# Patient Record
Sex: Female | Born: 1985 | Race: White | Hispanic: No | Marital: Single | Smoking: Never smoker
Health system: Southern US, Community
[De-identification: ages and names within clinical notes are randomized; demographics above are authoritative.]

## PROBLEM LIST (undated history)

## (undated) DIAGNOSIS — K219 Gastro-esophageal reflux disease without esophagitis: Secondary | ICD-10-CM

## (undated) HISTORY — DX: Gastro-esophageal reflux disease without esophagitis: K21.9

## (undated) HISTORY — PX: DILATION AND CURETTAGE OF UTERUS: SHX78

## (undated) HISTORY — PX: HERNIA REPAIR: SHX51

---

## 2014-07-01 DIAGNOSIS — L709 Acne, unspecified: Secondary | ICD-10-CM | POA: Insufficient documentation

## 2014-07-01 DIAGNOSIS — Z833 Family history of diabetes mellitus: Secondary | ICD-10-CM | POA: Insufficient documentation

## 2014-07-01 DIAGNOSIS — E042 Nontoxic multinodular goiter: Secondary | ICD-10-CM | POA: Insufficient documentation

## 2014-07-01 HISTORY — DX: Acne, unspecified: L70.9

## 2019-07-25 ENCOUNTER — Encounter: Payer: Self-pay | Admitting: Emergency Medicine

## 2019-07-25 ENCOUNTER — Ambulatory Visit
Admission: EM | Admit: 2019-07-25 | Discharge: 2019-07-25 | Disposition: A | Discharge status: Home / Self Care | Payer: Medicaid Other | Attending: Emergency Medicine | Admitting: Emergency Medicine

## 2019-07-25 ENCOUNTER — Other Ambulatory Visit: Payer: Self-pay

## 2019-07-25 DIAGNOSIS — N3001 Acute cystitis with hematuria: Secondary | ICD-10-CM

## 2019-07-25 DIAGNOSIS — B9689 Other specified bacterial agents as the cause of diseases classified elsewhere: Secondary | ICD-10-CM

## 2019-07-25 LAB — POCT URINALYSIS DIP (MANUAL ENTRY)
Bilirubin, UA: NEGATIVE
Glucose, UA: 100 mg/dL — AB
Ketones, POC UA: NEGATIVE mg/dL
Nitrite, UA: NEGATIVE
Protein Ur, POC: 30 mg/dL — AB
Spec Grav, UA: >=1.03 — AB (ref 1.010–1.025)
Urobilinogen, UA: 2 E.U./dL — AB
pH, UA: 7 (ref 5.0–8.0)

## 2019-07-25 LAB — POCT URINE PREGNANCY: Preg Test, Ur: NEGATIVE

## 2019-07-25 MED ORDER — CEPHALEXIN 500 MG PO CAPS: 500.0000 mg | ORAL_CAPSULE | Freq: Two times a day (BID) | ORAL | 0 refills | Status: AC

## 2019-07-25 NOTE — ED Provider Notes (Signed)
[name]    CSN: [id number] Arrival date & time: 07/25/19  1156      History   Chief Complaint Chief Complaint  Patient presents with  . Dysuria    HPI Penny Bartlett is a 34 y.o. female with history of UTI presenting for possible UTI.  Endorsing burning with urination since this morning.  Denying urinary urgency, frequency, vaginal discharge, pelvic or abdominal pain, back pain, fever.  Denies history of renal calculi.  LMP 2/26.  Has not taken anything for symptoms.    History reviewed. No pertinent past medical history.  There are no problems to display for this patient.   History reviewed. No pertinent surgical history.  OB History   No obstetric history on file.      Home Medications    Prior to Admission medications   Medication Sig Start Date End Date Taking? Authorizing Provider  cephALEXin (KEFLEX) 500 MG capsule Take 1 capsule (500 mg total) by mouth 2 (two) times daily for 5 days. 07/25/19 07/30/19  Hall-Potvin, [place], PA-C    Family History Family History  Family history unknown: Yes    Social History Social History   Tobacco Use  . Smoking status: Never Smoker  . Smokeless tobacco: Never Used  Substance Use Topics  . Alcohol use: Not Currently  . Drug use: Never     Allergies   Patient has no known allergies.   Review of Systems As per HPI   Physical Exam Triage Vital Signs ED Triage Vitals  Enc Vitals Group     BP      Pulse      Resp      Temp      Temp src      SpO2      Weight      Height      Head Circumference      Peak Flow      Pain Score      Pain Loc      Pain Edu?      Excl. in [place]?    No data found.  Updated Vital Signs BP 125/82 (BP Location: Left Arm)   Pulse 68   Temp (!) 97.5 F (36.4 C) (Oral)   Resp 16   LMP 07/05/2019   SpO2 99%   Visual Acuity Right Eye Distance:   Left Eye Distance:   Bilateral Distance:    Right Eye Near:   Left Eye Near:    Bilateral Near:      Physical Exam Constitutional:      General: She is not in acute distress. HENT:     Head: Normocephalic and atraumatic.  Eyes:     General: No scleral icterus.    Pupils: Pupils are equal, round, and reactive to light.  Cardiovascular:     Rate and Rhythm: Normal rate.  Pulmonary:     Effort: Pulmonary effort is normal.  Abdominal:     General: Bowel sounds are normal.     Palpations: Abdomen is soft.     Tenderness: There is no abdominal tenderness. There is no right CVA tenderness, left CVA tenderness or guarding.  Skin:    Coloration: Skin is not jaundiced or pale.  Neurological:     Mental Status: She is alert and oriented to person, place, and time.      UC Treatments / Results  Labs (all labs ordered are listed, but only abnormal results are displayed) Labs Reviewed  POCT URINALYSIS  DIP (MANUAL ENTRY) - Abnormal; Notable for the following components:      Result Value   Clarity, UA cloudy (*)    Glucose, UA =100 (*)    Spec Grav, UA >=1.030 (*)    Blood, UA trace-intact (*)    Protein Ur, POC =30 (*)    Urobilinogen, UA 2.0 (*)    Leukocytes, UA Small (1+) (*)    All other components within normal limits  URINE CULTURE  POCT URINE PREGNANCY    EKG   Radiology No results found.  Procedures Procedures (including critical care time)  Medications Ordered in UC Medications - No data to display  Initial Impression / Assessment and Plan / UC Course  I have reviewed the triage vital signs and the nursing notes.  Pertinent labs & imaging results that were available during my care of the patient were reviewed by me and considered in my medical decision making (see chart for details).     Patient afebrile, nontoxic in office today.  Urine dipstick significant for glucosuria, trace intact blood, urobilinogen, protein, small leukocytes-culture pending.  Will start Keflex today.  Return precautions discussed, patient verbalized understanding and is agreeable  to plan. Final Clinical Impressions(s) / UC Diagnoses   Final diagnoses:  Acute cystitis with hematuria     Discharge Instructions     Take antibiotic as prescribed with food. Follow-up with PCP for further evaluation if needed. Return for worsening symptoms, abdominal pain, back pain, fever.    ED Prescriptions    Medication Sig Dispense Auth. Provider   cephALEXin (KEFLEX) 500 MG capsule Take 1 capsule (500 mg total) by mouth 2 (two) times daily for 5 days. 10 capsule Hall-Potvin, [place], PA-C     PDMP not reviewed this encounter.   Hall-Potvin, [place], [place] 07/25/19 1301

## 2019-07-25 NOTE — ED Triage Notes (Signed)
Pt presents to [facility] for assessment of burning with urination starting this morning.  Denies any other symptoms, hx of UTIs.  LMP 2/26.

## 2019-07-25 NOTE — Discharge Instructions (Signed)
Take antibiotic as prescribed with food. Follow-up with PCP for further evaluation if needed. Return for worsening symptoms, abdominal pain, back pain, fever.

## 2019-07-27 LAB — URINE CULTURE: Culture: 70000 — AB

## 2019-11-15 ENCOUNTER — Encounter: Payer: Self-pay | Admitting: Emergency Medicine

## 2019-11-15 ENCOUNTER — Ambulatory Visit
Admission: EM | Admit: 2019-11-15 | Discharge: 2019-11-15 | Disposition: A | Discharge status: Home / Self Care | Payer: Medicaid Other

## 2019-11-15 ENCOUNTER — Other Ambulatory Visit: Payer: Self-pay

## 2019-11-15 DIAGNOSIS — R21 Rash and other nonspecific skin eruption: Secondary | ICD-10-CM

## 2019-11-15 MED ORDER — TRIAMCINOLONE ACETONIDE 0.1 % EX CREA: 1.0000 "application " | TOPICAL_CREAM | Freq: Two times a day (BID) | CUTANEOUS | 0 refills | Status: DC

## 2019-11-15 NOTE — ED Provider Notes (Signed)
[name]    CSN: [id number] Arrival date & time: 11/15/19  1805      History   Chief Complaint Chief Complaint  Patient presents with  . Rash    HPI Penny Bartlett is a 34 y.o. female.   34 year old female comes in for 2 day history of right upper arm rash. States felt the area and noticed the rash. Occasional itching, especially when outdoors. Denies spreading rash, spreading erythema, warmth, fever. No changes in hygiene products. But states possible exposures as around cats/dogs, and partner works outside with exposures to poison ivy.      History reviewed. No pertinent past medical history.  There are no problems to display for this patient.   History reviewed. No pertinent surgical history.  OB History   No obstetric history on file.      Home Medications    Prior to Admission medications   Medication Sig Start Date End Date Taking? Authorizing Provider  omeprazole (PRILOSEC) 20 MG capsule Take 20 mg by mouth daily.   Yes [provider]  triamcinolone cream (KENALOG) 0.1 % Apply 1 application topically 2 (two) times daily. 11/15/19   Belinda Fisher, PA-C    Family History Family History  Problem Relation Age of Onset  . Diabetes Mother   . Hypertension Mother     Social History Social History   Tobacco Use  . Smoking status: Never Smoker  . Smokeless tobacco: Never Used  Substance Use Topics  . Alcohol use: Not Currently  . Drug use: Never     Allergies   Patient has no known allergies.   Review of Systems Review of Systems  Reason unable to perform ROS: See HPI as above.     Physical Exam Triage Vital Signs ED Triage Vitals [11/15/19 1819]  Enc Vitals Group     BP 113/75     Pulse Rate 91     Resp 18     Temp 98.3 F (36.8 C)     Temp Source Oral     SpO2 98 %     Weight      Height      Head Circumference      Peak Flow      Pain Score 0     Pain Loc      Pain Edu?      Excl. in GC?    No data  found.  Updated Vital Signs BP 113/75 (BP Location: Left Arm)   Pulse 91   Temp 98.3 F (36.8 C) (Oral)   Resp 18   LMP 10/13/2019   SpO2 98%   Physical Exam Constitutional:      General: She is not in acute distress.    Appearance: Normal appearance. She is well-developed. She is not toxic-appearing or diaphoretic.  HENT:     Head: Normocephalic and atraumatic.  Eyes:     Conjunctiva/sclera: Conjunctivae normal.     Pupils: Pupils are equal, round, and reactive to light.  Pulmonary:     Effort: Pulmonary effort is normal. No respiratory distress.     Comments: Speaking in full sentences without difficulty Musculoskeletal:     Cervical back: Normal range of motion and neck supple.  Skin:    General: Skin is warm and dry.     Comments: Few maculopapular rash in linear pattern to medial elbow. No surrounding erythema, warmth. No tenderness  Neurological:     Mental Status: She is alert and  oriented to person, place, and time.      UC Treatments / Results  Labs (all labs ordered are listed, but only abnormal results are displayed) Labs Reviewed - No data to display  EKG   Radiology No results found.  Procedures Procedures (including critical care time)  Medications Ordered in UC Medications - No data to display  Initial Impression / Assessment and Plan / UC Course  I have reviewed the triage vital signs and the nursing notes.  Pertinent labs & imaging results that were available during my care of the patient were reviewed by me and considered in my medical decision making (see chart for details).    Likely contact dermatitis. Triamcinolone if needed for itching. Otherwise, continue to monitor for any exposures. Return precautions given.  Final Clinical Impressions(s) / UC Diagnoses   Final diagnoses:  Rash   ED Prescriptions    Medication Sig Dispense Auth. Provider   triamcinolone cream (KENALOG) 0.1 % Apply 1 application topically 2 (two) times daily.  30 g Belinda Fisher, PA-C     PDMP not reviewed this encounter.   Belinda Fisher, PA-C 11/15/19 1846

## 2019-11-15 NOTE — ED Triage Notes (Signed)
Pt presents to rash to right upper arm x 2 days.  States she is around cats, dogs, and her husband works outside.  Patient states randomly itchy.

## 2019-11-15 NOTE — Discharge Instructions (Signed)
Triamcinolone as directed. Avoid any new products. Monitor for spreading redness, increased warmth, pain, follow up for reevaluation.

## 2020-05-09 NOTE — L&D Delivery Note (Signed)
Delivery Note Penny Bartlett is a [id number] at [redacted]w[redacted]d who had a spontaneous delivery at 1958 a viable female ("Penny Bartlett") was delivered via ROA.  APGAR: 8, 9; weight 8lb6oz  .     Admitted for elective induction of labor. Induced with pitocin and AROM. Progressed normally. Received epidural for pain management. Pushed for 15 minutes. Baby was delivered without difficulty. Tight nuchal cord x 2 reduced after delivery.  Delayed cord clamping for 60 seconds. Delivery of placenta was spontaneous. Placenta was found to be intact, 3 -vessel cord was noted. The fundus was found to be firm. Perineum intact. Estimated blood loss 250cc. Instrument and gauze counts were correct at the end of the procedure.   Placenta status: to L&D for disposal  Anesthesia:  epidural Episiotomy:  none Lacerations:  none Suture Repair: none Est. Blood Loss (mL):   Mom to postpartum.  Baby to Couplet care / Skin to Skin.  [name] 02/18/2021, 8:20 PM

## 2020-05-11 ENCOUNTER — Ambulatory Visit
Admission: EM | Admit: 2020-05-11 | Discharge: 2020-05-11 | Disposition: A | Discharge status: Home / Self Care | Payer: Medicaid Other | Attending: Emergency Medicine | Admitting: Emergency Medicine

## 2020-05-11 DIAGNOSIS — B9789 Other viral agents as the cause of diseases classified elsewhere: Secondary | ICD-10-CM

## 2020-05-11 DIAGNOSIS — J028 Acute pharyngitis due to other specified organisms: Secondary | ICD-10-CM | POA: Diagnosis present

## 2020-05-11 DIAGNOSIS — J020 Streptococcal pharyngitis: Secondary | ICD-10-CM | POA: Diagnosis present

## 2020-05-11 DIAGNOSIS — Z20822 Contact with and (suspected) exposure to covid-19: Secondary | ICD-10-CM

## 2020-05-11 LAB — POCT RAPID STREP A (OFFICE): Rapid Strep A Screen: NEGATIVE

## 2020-05-11 MED ORDER — IBUPROFEN 800 MG PO TABS: 800.0000 mg | ORAL_TABLET | Freq: Three times a day (TID) | ORAL | 0 refills | Status: DC

## 2020-05-11 NOTE — ED Triage Notes (Signed)
Patient presents to Urgent Care with complaints of a sore throat x 2 days. She noted redness and white spots  yesterday. Treating symptoms with Dayquil.   Denies fever, n/v, or abdominal pain.

## 2020-05-11 NOTE — Discharge Instructions (Signed)
Sore Throat  Your rapid strep tested Negative today.  Covid test pending.  Sore throat likely viral and should resolve with time over the next 4 to 5 days.  Please continue Tylenol or Ibuprofen for fever and pain. May try salt water gargles, cepacol lozenges, throat spray, or OTC cold relief medicine for throat discomfort. If you also have congestion take a daily anti-histamine like Zyrtec, Claritin, and a oral decongestant to help with post nasal drip that may be irritating your throat.   Stay hydrated and drink plenty of fluids to keep your throat coated relieve irritation.

## 2020-05-11 NOTE — ED Provider Notes (Signed)
[name]    CSN: [id number] Arrival date & time: 05/11/20  0854      History   Chief Complaint Chief Complaint  Patient presents with  . Sore Throat    HPI Penny Bartlett is a 35 y.o. female presenting today for evaluation of sore throat.  Reports over the past 3 days has had sore throat.  Denies other associated URI symptoms of congestion or cough.  Denies fevers chills or body aches.  Denies nausea vomiting or diarrhea.  Noticed some redness in the white spot yesterday.  Using DayQuil.  Denies any close contacts with similar symptoms.  HPI  History reviewed. No pertinent past medical history.  There are no problems to display for this patient.   History reviewed. No pertinent surgical history.  OB History   No obstetric history on file.      Home Medications    Prior to Admission medications   Medication Sig Start Date End Date Taking? Authorizing Provider  omeprazole (PRILOSEC) 20 MG capsule Take 20 mg by mouth daily.    [provider]  triamcinolone cream (KENALOG) 0.1 % Apply 1 application topically 2 (two) times daily. 11/15/19   Belinda Fisher, PA-C    Family History Family History  Problem Relation Age of Onset  . Diabetes Mother   . Hypertension Mother     Social History Social History   Tobacco Use  . Smoking status: Never Smoker  . Smokeless tobacco: Never Used  Substance Use Topics  . Alcohol use: Not Currently  . Drug use: Never     Allergies   Patient has no known allergies.   Review of Systems Review of Systems  Constitutional: Negative for activity change, appetite change, chills, fatigue and fever.  HENT: Positive for congestion, rhinorrhea and sore throat. Negative for ear pain, sinus pressure and trouble swallowing.   Eyes: Negative for discharge and redness.  Respiratory: Positive for cough. Negative for chest tightness and shortness of breath.   Cardiovascular: Negative for chest pain.  Gastrointestinal:  Negative for abdominal pain, diarrhea, nausea and vomiting.  Musculoskeletal: Negative for myalgias.  Skin: Negative for rash.  Neurological: Negative for dizziness, light-headedness and headaches.     Physical Exam Triage Vital Signs ED Triage Vitals  Enc Vitals Group     BP      Pulse      Resp      Temp      Temp src      SpO2      Weight      Height      Head Circumference      Peak Flow      Pain Score      Pain Loc      Pain Edu?      Excl. in GC?    No data found.  Updated Vital Signs BP 122/80 (BP Location: Left Arm)   Pulse 74   Temp 98.1 F (36.7 C)   Resp 16   Wt 174 lb (78.9 kg)   LMP 04/24/2020   SpO2 98%   Visual Acuity Right Eye Distance:   Left Eye Distance:   Bilateral Distance:    Right Eye Near:   Left Eye Near:    Bilateral Near:     Physical Exam Vitals and nursing note reviewed.  Constitutional:      Appearance: She is well-developed and well-nourished.     Comments: No acute distress  HENT:  Head: Normocephalic and atraumatic.     Ears:     Comments: Bilateral ears without tenderness to palpation of external auricle, tragus and mastoid, EAC's without erythema or swelling, TM's with good bony landmarks and cone of light. Non erythematous.     Nose: Nose normal.     Mouth/Throat:     Comments: Oral mucosa pink and moist, no tonsillar enlargement or exudate, mild erythema noted just anterior to tonsils without swelling, posterior pharynx patent and nonerythematous, no uvula deviation or swelling. Normal phonation. Eyes:     Conjunctiva/sclera: Conjunctivae normal.  Cardiovascular:     Rate and Rhythm: Normal rate.  Pulmonary:     Effort: Pulmonary effort is normal. No respiratory distress.     Comments: Breathing comfortably at rest, CTABL, no wheezing, rales or other adventitious sounds auscultated Abdominal:     General: There is no distension.  Musculoskeletal:        General: Normal range of motion.     Cervical back:  Neck supple.  Skin:    General: Skin is warm and dry.  Neurological:     Mental Status: She is alert and oriented to person, place, and time.  Psychiatric:        Mood and Affect: Mood and affect normal.      UC Treatments / Results  Labs (all labs ordered are listed, but only abnormal results are displayed) Labs Reviewed  NOVEL CORONAVIRUS, NAA  CULTURE, GROUP A STREP [facility])  POCT RAPID STREP A (OFFICE)    EKG   Radiology No results found.  Procedures Procedures (including critical care time)  Medications Ordered in UC Medications - No data to display  Initial Impression / Assessment and Plan / UC Course  I have reviewed the triage vital signs and the nursing notes.  Pertinent labs & imaging results that were available during my care of the patient were reviewed by me and considered in my medical decision making (see chart for details).     Strep test negative, Covid test pending, recommending symptomatic and supportive care of sore throat with close monitoring, suspect viral etiology.  Discussed strict return precautions. Patient verbalized understanding and is agreeable with plan.  Final Clinical Impressions(s) / UC Diagnoses   Final diagnoses:  Encounter for screening laboratory testing for COVID-19 virus  Streptococcal sore throat  Sore throat (viral)     Discharge Instructions     Sore Throat  Your rapid strep tested Negative today.  Covid test pending.  Sore throat likely viral and should resolve with time over the next 4 to 5 days.  Please continue Tylenol or Ibuprofen for fever and pain. May try salt water gargles, cepacol lozenges, throat spray, or OTC cold relief medicine for throat discomfort. If you also have congestion take a daily anti-histamine like Zyrtec, Claritin, and a oral decongestant to help with post nasal drip that may be irritating your throat.   Stay hydrated and drink plenty of fluids to keep your throat coated relieve irritation.      ED Prescriptions    None     PDMP not reviewed this encounter.   Lew Dawes, [place] 05/11/20 [phone]

## 2020-05-13 ENCOUNTER — Encounter: Payer: Self-pay | Admitting: Emergency Medicine

## 2020-05-13 ENCOUNTER — Ambulatory Visit
Admission: EM | Admit: 2020-05-13 | Discharge: 2020-05-13 | Disposition: A | Discharge status: Home / Self Care | Payer: Medicaid Other | Attending: Internal Medicine | Admitting: Internal Medicine

## 2020-05-13 DIAGNOSIS — Z20822 Contact with and (suspected) exposure to covid-19: Secondary | ICD-10-CM | POA: Diagnosis not present

## 2020-05-13 LAB — NOVEL CORONAVIRUS, NAA

## 2020-05-13 NOTE — ED Triage Notes (Signed)
Pt here for covid recollection

## 2020-05-14 LAB — CULTURE, GROUP A STREP (THRC)

## 2020-05-15 LAB — SARS-COV-2, NAA 2 DAY TAT

## 2020-05-15 LAB — NOVEL CORONAVIRUS, NAA: SARS-CoV-2, NAA: NOT DETECTED

## 2020-06-21 ENCOUNTER — Ambulatory Visit
Admission: EM | Admit: 2020-06-21 | Discharge: 2020-06-21 | Disposition: A | Discharge status: Home / Self Care | Payer: Medicaid Other | Attending: Internal Medicine | Admitting: Internal Medicine

## 2020-06-21 ENCOUNTER — Other Ambulatory Visit: Payer: Self-pay

## 2020-06-21 DIAGNOSIS — Z3201 Encounter for pregnancy test, result positive: Secondary | ICD-10-CM

## 2020-06-21 LAB — POCT URINE PREGNANCY: Preg Test, Ur: POSITIVE — AB

## 2020-06-21 NOTE — ED Triage Notes (Signed)
Patient believes she may be pregnant and is here for a test. PT does not want the child with her to know. PT is aox4 and ambulatory.

## 2020-06-21 NOTE — ED Provider Notes (Signed)
Penny Bartlett    CSN: [id number] Arrival date & time: 06/21/20  1445      History   Chief Complaint Chief Complaint  Patient presents with  . Possible Pregnancy    Wants test    HPI Penny Bartlett is a 35 y.o. female.   Here today requesting pregnancy confirmation. States her period is to start tomorrow and she has felt bloated so she took several home pregnancy tests that were all positive. Not on contraception at this time. Denies vaginal bleeding, cramping, abdominal pain, N/V/D. Does not currently have an OBGYN in the area, moved here from another county.      History reviewed. No pertinent past medical history.  There are no problems to display for this patient.   History reviewed. No pertinent surgical history.  OB History    Gravida  1   Para      Term      Preterm      AB      Living        SAB      IAB      Ectopic      Multiple      Live Births               Home Medications    Prior to Admission medications   Medication Sig Start Date End Date Taking? Authorizing Provider  ibuprofen (ADVIL) 800 MG tablet Take 1 tablet (800 mg total) by mouth 3 (three) times daily. 05/11/20  Yes Wieters, Hallie C, PA-C  omeprazole (PRILOSEC) 20 MG capsule Take 20 mg by mouth daily.    [provider]  triamcinolone cream (KENALOG) 0.1 % Apply 1 application topically 2 (two) times daily. 11/15/19   Belinda Fisher, PA-C    Family History Family History  Problem Relation Age of Onset  . Diabetes Mother   . Hypertension Mother     Social History Social History   Tobacco Use  . Smoking status: Never Smoker  . Smokeless tobacco: Never Used  Vaping Use  . Vaping Use: Never used  Substance Use Topics  . Alcohol use: Not Currently  . Drug use: Never     Allergies   Patient has no known allergies.   Review of Systems Review of Systems PER HPI   Physical Exam Triage Vital Signs ED Triage Vitals  Enc Vitals Group     BP       Pulse      Resp      Temp      Temp src      SpO2      Weight      Height      Head Circumference      Peak Flow      Pain Score      Pain Loc      Pain Edu?      Excl. in GC?    No data found.  Updated Vital Signs BP 114/75 (BP Location: Left Arm)   Pulse (!) 109   Temp 98.2 F (36.8 C) (Oral)   Resp 18   LMP 04/24/2020   SpO2 98%   Visual Acuity Right Eye Distance:   Left Eye Distance:   Bilateral Distance:    Right Eye Near:   Left Eye Near:    Bilateral Near:     Physical Exam Vitals and nursing note reviewed.  Constitutional:      Appearance: Normal appearance. She is  not ill-appearing.  HENT:     Head: Atraumatic.  Eyes:     Extraocular Movements: Extraocular movements intact.     Conjunctiva/sclera: Conjunctivae normal.  Cardiovascular:     Rate and Rhythm: Normal rate and regular rhythm.     Heart sounds: Normal heart sounds.  Pulmonary:     Effort: Pulmonary effort is normal.     Breath sounds: Normal breath sounds.  Abdominal:     General: Bowel sounds are normal. There is no distension.     Palpations: Abdomen is soft.     Tenderness: There is no abdominal tenderness. There is no right CVA tenderness, left CVA tenderness or guarding.  Musculoskeletal:        General: Normal range of motion.     Cervical back: Normal range of motion and neck supple.  Skin:    General: Skin is warm and dry.  Neurological:     Mental Status: She is alert and oriented to person, place, and time.  Psychiatric:        Mood and Affect: Mood normal.        Thought Content: Thought content normal.        Judgment: Judgment normal.      UC Treatments / Results  Labs (all labs ordered are listed, but only abnormal results are displayed) Labs Reviewed  POCT URINE PREGNANCY - Abnormal; Notable for the following components:      Result Value   Preg Test, Ur Positive (*)    All other components within normal limits    EKG   Radiology No results  found.  Procedures Procedures (including critical Bartlett time)  Medications Ordered in UC Medications - No data to display  Initial Impression / Assessment and Plan / UC Course  I have reviewed the triage vital signs and the nursing notes.  Pertinent labs & imaging results that were available during my Bartlett of the patient were reviewed by me and considered in my medical decision making (see chart for details).     Urine preg positive, discussed prenatal vitamins and lifestyle, resources given for local OBGYN clinics. Patient to schedule initial OB visit.   Final Clinical Impressions(s) / UC Diagnoses   Final diagnoses:  Positive pregnancy test   Discharge Instructions   None    ED Prescriptions    None     PDMP not reviewed this encounter.   Particia Bartlett, [place] 06/21/20 1530

## 2020-07-31 LAB — OB RESULTS CONSOLE RUBELLA ANTIBODY, IGM: Rubella: IMMUNE

## 2020-07-31 LAB — OB RESULTS CONSOLE GC/CHLAMYDIA
Chlamydia: NEGATIVE
Gonorrhea: NEGATIVE

## 2020-07-31 LAB — OB RESULTS CONSOLE ABO/RH: RH Type: POSITIVE

## 2020-07-31 LAB — OB RESULTS CONSOLE HEPATITIS B SURFACE ANTIGEN: Hepatitis B Surface Ag: NEGATIVE

## 2020-07-31 LAB — OB RESULTS CONSOLE ANTIBODY SCREEN: Antibody Screen: NEGATIVE

## 2020-07-31 LAB — OB RESULTS CONSOLE HIV ANTIBODY (ROUTINE TESTING): HIV: NONREACTIVE

## 2020-07-31 LAB — OB RESULTS CONSOLE RPR: RPR: NONREACTIVE

## 2020-08-27 ENCOUNTER — Other Ambulatory Visit: Payer: Self-pay

## 2020-08-27 ENCOUNTER — Other Ambulatory Visit: Payer: Self-pay | Admitting: Obstetrics and Gynecology

## 2020-08-27 DIAGNOSIS — O09522 Supervision of elderly multigravida, second trimester: Secondary | ICD-10-CM

## 2020-09-28 ENCOUNTER — Encounter: Payer: Self-pay | Admitting: *Deleted

## 2020-10-01 ENCOUNTER — Ambulatory Visit: Payer: Medicaid Other | Attending: Obstetrics and Gynecology

## 2020-10-01 ENCOUNTER — Other Ambulatory Visit: Payer: Self-pay | Admitting: Obstetrics

## 2020-10-01 ENCOUNTER — Other Ambulatory Visit: Payer: Self-pay

## 2020-10-01 ENCOUNTER — Ambulatory Visit: Payer: Medicaid Other | Admitting: *Deleted

## 2020-10-01 ENCOUNTER — Encounter: Payer: Self-pay | Admitting: *Deleted

## 2020-10-01 VITALS — BP 116/58 | HR 79 | Ht 66.0 in

## 2020-10-01 DIAGNOSIS — O09522 Supervision of elderly multigravida, second trimester: Secondary | ICD-10-CM

## 2020-10-01 DIAGNOSIS — O359XX0 Maternal care for (suspected) fetal abnormality and damage, unspecified, not applicable or unspecified: Secondary | ICD-10-CM

## 2020-10-28 ENCOUNTER — Ambulatory Visit: Payer: Medicaid Other | Admitting: *Deleted

## 2020-10-28 ENCOUNTER — Other Ambulatory Visit: Payer: Self-pay

## 2020-10-28 ENCOUNTER — Ambulatory Visit: Payer: Medicaid Other | Attending: Obstetrics

## 2020-10-28 VITALS — BP 119/62 | HR 77

## 2020-10-28 DIAGNOSIS — O09522 Supervision of elderly multigravida, second trimester: Secondary | ICD-10-CM

## 2020-10-28 DIAGNOSIS — O359XX0 Maternal care for (suspected) fetal abnormality and damage, unspecified, not applicable or unspecified: Secondary | ICD-10-CM | POA: Insufficient documentation

## 2020-10-28 DIAGNOSIS — Z3A22 22 weeks gestation of pregnancy: Secondary | ICD-10-CM | POA: Diagnosis not present

## 2020-10-28 DIAGNOSIS — Z362 Encounter for other antenatal screening follow-up: Secondary | ICD-10-CM

## 2021-01-28 LAB — OB RESULTS CONSOLE GBS: GBS: POSITIVE

## 2021-02-11 ENCOUNTER — Encounter (HOSPITAL_COMMUNITY): Payer: Self-pay | Admitting: *Deleted

## 2021-02-11 ENCOUNTER — Telehealth (HOSPITAL_COMMUNITY): Payer: Self-pay | Admitting: *Deleted

## 2021-02-11 ENCOUNTER — Encounter (HOSPITAL_COMMUNITY): Payer: Self-pay

## 2021-02-11 NOTE — Telephone Encounter (Signed)
Preadmission screen  

## 2021-02-16 ENCOUNTER — Other Ambulatory Visit: Payer: Self-pay | Admitting: Obstetrics and Gynecology

## 2021-02-16 LAB — SARS CORONAVIRUS 2 (TAT 6-24 HRS): SARS Coronavirus 2: NEGATIVE

## 2021-02-18 ENCOUNTER — Inpatient Hospital Stay (HOSPITAL_COMMUNITY)
Admission: AD | Admit: 2021-02-18 | Discharge: 2021-02-20 | DRG: 806 | Disposition: A | Discharge status: Home / Self Care | Payer: Medicaid Other | Attending: Obstetrics and Gynecology | Admitting: Obstetrics and Gynecology

## 2021-02-18 ENCOUNTER — Other Ambulatory Visit: Payer: Self-pay

## 2021-02-18 ENCOUNTER — Inpatient Hospital Stay (HOSPITAL_COMMUNITY): Payer: Medicaid Other

## 2021-02-18 ENCOUNTER — Inpatient Hospital Stay (HOSPITAL_COMMUNITY): Payer: Medicaid Other | Admitting: Anesthesiology

## 2021-02-18 ENCOUNTER — Encounter (HOSPITAL_COMMUNITY): Payer: Self-pay | Admitting: Obstetrics and Gynecology

## 2021-02-18 DIAGNOSIS — Z3A39 39 weeks gestation of pregnancy: Secondary | ICD-10-CM | POA: Diagnosis not present

## 2021-02-18 DIAGNOSIS — O9081 Anemia of the puerperium: Secondary | ICD-10-CM | POA: Diagnosis not present

## 2021-02-18 DIAGNOSIS — D62 Acute posthemorrhagic anemia: Secondary | ICD-10-CM | POA: Diagnosis not present

## 2021-02-18 DIAGNOSIS — O99824 Streptococcus B carrier state complicating childbirth: Secondary | ICD-10-CM | POA: Diagnosis present

## 2021-02-18 DIAGNOSIS — O09529 Supervision of elderly multigravida, unspecified trimester: Secondary | ICD-10-CM

## 2021-02-18 DIAGNOSIS — O26893 Other specified pregnancy related conditions, third trimester: Secondary | ICD-10-CM | POA: Diagnosis present

## 2021-02-18 LAB — CBC
HCT: 31.6 % — ABNORMAL LOW (ref 36.0–46.0)
Hemoglobin: 10.9 g/dL — ABNORMAL LOW (ref 12.0–15.0)
MCH: 32.7 pg (ref 26.0–34.0)
MCHC: 34.5 g/dL (ref 30.0–36.0)
MCV: 94.9 fL (ref 80.0–100.0)
Platelets: 195 10*3/uL (ref 150–400)
RBC: 3.33 MIL/uL — ABNORMAL LOW (ref 3.87–5.11)
RDW: 13.5 % (ref 11.5–15.5)
WBC: 10.2 10*3/uL (ref 4.0–10.5)
nRBC: 0 % (ref 0.0–0.2)

## 2021-02-18 LAB — TYPE AND SCREEN
ABO/RH(D): A POS
Antibody Screen: NEGATIVE

## 2021-02-18 LAB — RPR: RPR Ser Ql: NONREACTIVE

## 2021-02-18 MED ORDER — DIBUCAINE (PERIANAL) 1 % EX OINT: 1.0000 "application " | TOPICAL_OINTMENT | CUTANEOUS | Status: DC | PRN

## 2021-02-18 MED ORDER — ONDANSETRON HCL 4 MG/2ML IJ SOLN: 4.0000 mg | Freq: Four times a day (QID) | INTRAMUSCULAR | Status: DC | PRN

## 2021-02-18 MED ORDER — FENTANYL-BUPIVACAINE-NACL 0.5-0.125-0.9 MG/250ML-% EP SOLN
12.0000 mL/h | EPIDURAL | Status: DC | PRN
  Administered 2021-02-18: 12 mL/h via EPIDURAL
  Filled 2021-02-18: qty 250

## 2021-02-18 MED ORDER — ONDANSETRON HCL 4 MG PO TABS: 4.0000 mg | ORAL_TABLET | ORAL | Status: DC | PRN

## 2021-02-18 MED ORDER — LIDOCAINE HCL (PF) 1 % IJ SOLN: 30.0000 mL | INTRAMUSCULAR | Status: DC | PRN

## 2021-02-18 MED ORDER — DIPHENHYDRAMINE HCL 25 MG PO CAPS: 25.0000 mg | ORAL_CAPSULE | Freq: Four times a day (QID) | ORAL | Status: DC | PRN

## 2021-02-18 MED ORDER — ONDANSETRON HCL 4 MG/2ML IJ SOLN: 4.0000 mg | INTRAMUSCULAR | Status: DC | PRN

## 2021-02-18 MED ORDER — TERBUTALINE SULFATE 1 MG/ML IJ SOLN: 0.2500 mg | Freq: Once | INTRAMUSCULAR | Status: DC | PRN

## 2021-02-18 MED ORDER — LIDOCAINE HCL (PF) 1 % IJ SOLN
INTRAMUSCULAR | Status: DC | PRN
  Administered 2021-02-18: 10 mL via EPIDURAL

## 2021-02-18 MED ORDER — TETANUS-DIPHTH-ACELL PERTUSSIS 5-2.5-18.5 LF-MCG/0.5 IM SUSY: 0.5000 mL | PREFILLED_SYRINGE | Freq: Once | INTRAMUSCULAR | Status: DC

## 2021-02-18 MED ORDER — IBUPROFEN 600 MG PO TABS
600.0000 mg | ORAL_TABLET | Freq: Four times a day (QID) | ORAL | Status: DC
  Administered 2021-02-19 – 2021-02-20 (×6): 600 mg via ORAL
  Filled 2021-02-18 (×6): qty 1

## 2021-02-18 MED ORDER — FENTANYL CITRATE (PF) 100 MCG/2ML IJ SOLN: 50.0000 ug | INTRAMUSCULAR | Status: DC | PRN

## 2021-02-18 MED ORDER — OXYTOCIN BOLUS FROM INFUSION
333.0000 mL | Freq: Once | INTRAVENOUS | Status: AC
  Administered 2021-02-18: 333 mL via INTRAVENOUS

## 2021-02-18 MED ORDER — LACTATED RINGERS IV SOLN: 500.0000 mL | INTRAVENOUS | Status: DC | PRN

## 2021-02-18 MED ORDER — DOCUSATE SODIUM 100 MG PO CAPS
100.0000 mg | ORAL_CAPSULE | Freq: Two times a day (BID) | ORAL | Status: DC
  Administered 2021-02-19 – 2021-02-20 (×3): 100 mg via ORAL
  Filled 2021-02-18 (×3): qty 1

## 2021-02-18 MED ORDER — SOD CITRATE-CITRIC ACID 500-334 MG/5ML PO SOLN
30.0000 mL | ORAL | Status: DC | PRN
  Administered 2021-02-18: 30 mL via ORAL
  Filled 2021-02-18: qty 30

## 2021-02-18 MED ORDER — OXYCODONE HCL 5 MG PO TABS: 10.0000 mg | ORAL_TABLET | ORAL | Status: DC | PRN

## 2021-02-18 MED ORDER — PENICILLIN G POT IN DEXTROSE 60000 UNIT/ML IV SOLN
3.0000 10*6.[IU] | INTRAVENOUS | Status: DC
Start: 2021-02-18 — End: 2021-02-18
  Administered 2021-02-18 (×2): 3 10*6.[IU] via INTRAVENOUS
  Filled 2021-02-18 (×2): qty 50

## 2021-02-18 MED ORDER — COCONUT OIL OIL: 1.0000 "application " | TOPICAL_OIL | Status: DC | PRN

## 2021-02-18 MED ORDER — FLEET ENEMA 7-19 GM/118ML RE ENEM: 1.0000 | ENEMA | Freq: Every day | RECTAL | Status: DC | PRN

## 2021-02-18 MED ORDER — OXYTOCIN-SODIUM CHLORIDE 30-0.9 UT/500ML-% IV SOLN
1.0000 m[IU]/min | INTRAVENOUS | Status: DC
  Administered 2021-02-18: 2 m[IU]/min via INTRAVENOUS

## 2021-02-18 MED ORDER — SIMETHICONE 80 MG PO CHEW: 80.0000 mg | CHEWABLE_TABLET | ORAL | Status: DC | PRN

## 2021-02-18 MED ORDER — OXYTOCIN-SODIUM CHLORIDE 30-0.9 UT/500ML-% IV SOLN
2.5000 [IU]/h | INTRAVENOUS | Status: DC
  Administered 2021-02-18: 2.5 [IU]/h via INTRAVENOUS
  Filled 2021-02-18: qty 500

## 2021-02-18 MED ORDER — PRENATAL MULTIVITAMIN CH
1.0000 | ORAL_TABLET | Freq: Every day | ORAL | Status: DC
  Administered 2021-02-19 – 2021-02-20 (×2): 1 via ORAL
  Filled 2021-02-18 (×2): qty 1

## 2021-02-18 MED ORDER — OXYCODONE-ACETAMINOPHEN 5-325 MG PO TABS: 1.0000 | ORAL_TABLET | ORAL | Status: DC | PRN

## 2021-02-18 MED ORDER — EPHEDRINE 5 MG/ML INJ: 10.0000 mg | INTRAVENOUS | Status: DC | PRN

## 2021-02-18 MED ORDER — BISACODYL 10 MG RE SUPP: 10.0000 mg | Freq: Every day | RECTAL | Status: DC | PRN

## 2021-02-18 MED ORDER — PHENYLEPHRINE 40 MCG/ML (10ML) SYRINGE FOR IV PUSH (FOR BLOOD PRESSURE SUPPORT): 80.0000 ug | PREFILLED_SYRINGE | INTRAVENOUS | Status: DC | PRN

## 2021-02-18 MED ORDER — ACETAMINOPHEN 325 MG PO TABS: 650.0000 mg | ORAL_TABLET | ORAL | Status: DC | PRN

## 2021-02-18 MED ORDER — OXYCODONE HCL 5 MG PO TABS
5.0000 mg | ORAL_TABLET | ORAL | Status: DC | PRN
Start: 2021-02-18 — End: 2021-02-20

## 2021-02-18 MED ORDER — WITCH HAZEL-GLYCERIN EX PADS: 1.0000 "application " | MEDICATED_PAD | CUTANEOUS | Status: DC | PRN

## 2021-02-18 MED ORDER — BENZOCAINE-MENTHOL 20-0.5 % EX AERO: 1.0000 "application " | INHALATION_SPRAY | CUTANEOUS | Status: DC | PRN

## 2021-02-18 MED ORDER — MISOPROSTOL 25 MCG QUARTER TABLET: 25.0000 ug | ORAL_TABLET | ORAL | Status: DC | PRN

## 2021-02-18 MED ORDER — SODIUM CHLORIDE 0.9 % IV SOLN
5.0000 10*6.[IU] | Freq: Once | INTRAVENOUS | Status: AC
  Administered 2021-02-18: 5 10*6.[IU] via INTRAVENOUS
  Filled 2021-02-18: qty 5

## 2021-02-18 MED ORDER — LACTATED RINGERS IV SOLN
500.0000 mL | Freq: Once | INTRAVENOUS | Status: AC
  Administered 2021-02-18: 500 mL via INTRAVENOUS

## 2021-02-18 MED ORDER — LACTATED RINGERS IV SOLN
INTRAVENOUS | Status: DC
  Administered 2021-02-18: 950 mL via INTRAVENOUS
  Administered 2021-02-18: 1000 mL via INTRAVENOUS

## 2021-02-18 MED ORDER — DIPHENHYDRAMINE HCL 50 MG/ML IJ SOLN
12.5000 mg | INTRAMUSCULAR | Status: DC | PRN
Start: 2021-02-18 — End: 2021-02-18

## 2021-02-18 MED ORDER — OXYCODONE-ACETAMINOPHEN 5-325 MG PO TABS: 2.0000 | ORAL_TABLET | ORAL | Status: DC | PRN

## 2021-02-18 NOTE — Anesthesia Procedure Notes (Signed)
Epidural Patient location during procedure: OB Start time: 02/18/2021 12:03 PM End time: 02/18/2021 12:15 PM  Staffing Anesthesiologist: Lucretia Kern, MD Performed: anesthesiologist   Preanesthetic Checklist Completed: patient identified, IV checked, risks and benefits discussed, monitors and equipment checked, pre-op evaluation and timeout performed  Epidural Patient position: sitting Prep: DuraPrep Patient monitoring: heart rate, continuous pulse ox and blood pressure Approach: midline Location: L3-L4 Injection technique: LOR air  Needle:  Needle type: Tuohy  Needle gauge: 17 G Needle length: 9 cm Needle insertion depth: 7 cm Catheter type: closed end flexible Catheter size: 19 Gauge Catheter at skin depth: 12 cm Test dose: negative  Assessment Events: blood not aspirated, injection not painful, no injection resistance, no paresthesia and negative IV test  Additional Notes Reason for block:procedure for pain

## 2021-02-18 NOTE — Lactation Note (Signed)
This note was copied from a baby's chart. Lactation Consultation Note  Patient Name: Penny Bartlett Today's Date: 02/18/2021 Reason for consult: L&D Initial assessment;1st time breastfeeding;Term Age:35 hours  L&D consult with <60 minutes old infant and P5 mother. Congratulated family on newborn.  Offered assistance with latch, laid back position. Infant latched after a couple of attempts. Observed suckling and a few swallows.   Discussed STS as ideal transition for infants after birth helping with temperature, blood sugar and comfort. Talked about primal reflexes such as rooting, hands to mouth, searching for the breast among others. Explained LC services availability during postpartum stay. Thanked family for their time.      Maternal Data Has patient been taught Hand Expression?: Yes Does the patient have breastfeeding experience prior to this delivery?: Yes How long did the patient breastfeed?: a couple of days x4  Feeding Mother's Current Feeding Choice: Breast Milk  LATCH Score Latch: Grasps breast easily, tongue down, lips flanged, rhythmical sucking.  Audible Swallowing: A few with stimulation  Type of Nipple: Everted at rest and after stimulation  Comfort (Breast/Nipple): Soft / non-tender  Hold (Positioning): Assistance needed to correctly position infant at breast and maintain latch.  LATCH Score: 8  Interventions Interventions: Breast feeding basics reviewed;Assisted with latch;Skin to skin;Education  Discharge Pump: Personal WIC Program: Yes  Consult Status Consult Status: Follow-up from L&D    Penny Bartlett A Higuera Ancidey 02/18/2021, 8:45 PM

## 2021-02-18 NOTE — Plan of Care (Signed)
  Problem: Education: Goal: Knowledge of Childbirth will improve Outcome: Adequate for Discharge Goal: Ability to make informed decisions regarding treatment and plan of care will improve Outcome: Adequate for Discharge Goal: Ability to state and carry out methods to decrease the pain will improve Outcome: Adequate for Discharge Goal: Individualized Educational Video(s) Outcome: Not Applicable   Problem: Coping: Goal: Ability to verbalize concerns and feelings about labor and delivery will improve Outcome: Completed/Met   Problem: Life Cycle: Goal: Ability to make normal progression through stages of labor will improve Outcome: Completed/Met Goal: Ability to effectively push during vaginal delivery will improve Outcome: Completed/Met   Problem: Role Relationship: Goal: Will demonstrate positive interactions with the child Outcome: Completed/Met   Problem: Safety: Goal: Risk of complications during labor and delivery will decrease Outcome: Adequate for Discharge   Problem: Pain Management: Goal: Relief or control of pain from uterine contractions will improve Outcome: Adequate for Discharge

## 2021-02-18 NOTE — Progress Notes (Signed)
OB Progress Note  S: comfortable with epidural   O: Today's Vitals   02/18/21 1532 02/18/21 1601 02/18/21 1632 02/18/21 1645  BP: 110/68 118/73 109/60   Pulse: 79 84 81   Resp:      Temp:      TempSrc:      SpO2:      Weight:      Height:      PainSc:    0-No pain   Body mass index is 34.9 kg/m.  SVE 6.5/70/-1, AROM copious clear fluid   FHR: 120bpm, moderate variability, + accels, no decels Toco: ctx q 2-3 mins    A/P: 35Y [id number] @ [redacted]w[redacted]d, elective IOL Fetal wellbeing: cat I tracing IOL: continue pitocin, s/p AROM, anticipate SVD GBS positive: penicillin Pain control: epidural   M. Timothy Lasso, MD

## 2021-02-18 NOTE — H&P (Signed)
Penny Bartlett is a 35 y.o. female [id number] [redacted]w[redacted]d presenting for elective induction of labor. She reports no LOF, VB, Contractions. Normal FM.   Pregnancy c/b: Advanced maternal age: 78 History of fetal macrosomia: largest baby 9lb15oz, current pregnancy growth scan at 36weeks 6lb4oz (59%)  OB History     Gravida  6   Para  4   Term  4   Preterm      AB  1   Living  4      SAB  1   IAB      Ectopic      Multiple      Live Births             Past Medical History:  Diagnosis Date   GERD (gastroesophageal reflux disease)    Past Surgical History:  Procedure Laterality Date   DILATION AND CURETTAGE OF UTERUS     HERNIA REPAIR     Umbilical   Family History: family history includes Diabetes in her mother; Hypertension in her mother. Social History:  reports that she has never smoked. She has never used smokeless tobacco. She reports that she does not currently use alcohol. She reports that she does not use drugs.     Maternal Diabetes: No Genetic Screening: Normal Maternal Ultrasounds/Referrals: Normal, Growth scan at 36 weeks: EFW 59.9% (6lb4oz), BPP 8/8, vtx. Fetal Ultrasounds or other Referrals:  None Maternal Substance Abuse:  No Significant Maternal Medications:  None Significant Maternal Lab Results:  Group B Strep positive Other Comments:  None  Review of Systems Per HPI Exam Physical Exam  Dilation: 4 Effacement (%): 70 Station: -3 Exam by:: Londell Moh, RN Blood pressure 124/78, pulse 84, temperature 98.2 F (36.8 C), temperature source Oral, resp. rate 16, height 5' 6.5" (1.689 m), weight 99.6 kg, last menstrual period 05/21/2020. Gen: NAD, resting comfortably CVS: normal pulses Lungs: nonlabored respirations Abd: Gravid abdomen, Leop[place] 7.5Lbs Ext no calf edema or tenderness  Fetal testing: 120bpm, moderate variability, + accels, no decels Toco: ctx q 2-3 mins  Prenatal labs: ABO, Rh:  --/--/A POS (10/13 3818) Antibody: NEG  (10/13 4037) Rubella: Immune (03/25 0000) RPR: Nonreactive (03/25 0000)  HBsAg: Negative (03/25 0000)  HIV: Non-reactive (03/25 0000)  GBS: Positive/-- (09/22 0000)   Assessment/Plan: 54H [id number] @ [redacted]w[redacted]d, elective IOL Fetal wellbeing: cat I tracing IOL: continue pitocin, plan for AROM after 4 hours of abx and when fetal station lower GBS positive: penicillin Pain control: planning epidural prior to AROM  [name] 02/18/2021, 11:19 AM

## 2021-02-18 NOTE — Anesthesia Preprocedure Evaluation (Signed)
Anesthesia Evaluation  Patient identified by MRN, date of birth, ID band Patient awake    Reviewed: Allergy & Precautions, H&P , NPO status , Patient's Chart, lab work & pertinent test results  History of Anesthesia Complications Negative for: history of anesthetic complications  Airway Mallampati: II  TM Distance: >3 FB     Dental   Pulmonary neg pulmonary ROS,    Pulmonary exam normal        Cardiovascular negative cardio ROS   Rhythm:regular Rate:Normal     Neuro/Psych negative neurological ROS  negative psych ROS   GI/Hepatic Neg liver ROS, GERD  ,  Endo/Other  negative endocrine ROS  Renal/GU negative Renal ROS  negative genitourinary   Musculoskeletal negative musculoskeletal ROS (+)   Abdominal   Peds  Hematology negative hematology ROS (+)   Anesthesia Other Findings   Reproductive/Obstetrics (+) Pregnancy                             Anesthesia Physical Anesthesia Plan  ASA: 2  Anesthesia Plan: Epidural   Post-op Pain Management:    Induction:   PONV Risk Score and Plan:   Airway Management Planned:   Additional Equipment:   Intra-op Plan:   Post-operative Plan:   Informed Consent: I have reviewed the patients History and Physical, chart, labs and discussed the procedure including the risks, benefits and alternatives for the proposed anesthesia with the patient or authorized representative who has indicated his/her understanding and acceptance.       Plan Discussed with:   Anesthesia Plan Comments:         Anesthesia Quick Evaluation

## 2021-02-18 NOTE — Plan of Care (Signed)
  Problem: Education: Goal: Ability to make informed decisions regarding treatment and plan of care will improve Outcome: Progressing Goal: Ability to state and carry out methods to decrease the pain will improve Outcome: Progressing   Problem: Coping: Goal: Ability to verbalize concerns and feelings about labor and delivery will improve Outcome: Progressing

## 2021-02-18 NOTE — Progress Notes (Signed)
OB Progress Note  S: Comfortable with epidural   O: Today's Vitals   02/18/21 1336 02/18/21 1346 02/18/21 1402 02/18/21 1432  BP:   128/70 119/79  Pulse:   84 87  Resp:      Temp:      TempSrc:      SpO2: 97% 98%    Weight:      Height:      PainSc:       Body mass index is 34.9 kg/m.  SVE 5.5/70/-3, ballotable  FHR: 115bpm, mod variability, + accels, no decels Toco: ctx q 2-5 min   A/P: 35Y [id number] @ [redacted]w[redacted]d, elective IOL Fetal wellbeing: cat I tracing IOL: continue pitocin, fetal head very high and ballotable, will hold off on AROM until better engaged GBS positive: penicillin Pain control: epidural  M. Timothy Lasso, MD

## 2021-02-19 LAB — CBC
HCT: 26.2 % — ABNORMAL LOW (ref 36.0–46.0)
Hemoglobin: 8.9 g/dL — ABNORMAL LOW (ref 12.0–15.0)
MCH: 32.5 pg (ref 26.0–34.0)
MCHC: 34 g/dL (ref 30.0–36.0)
MCV: 95.6 fL (ref 80.0–100.0)
Platelets: 174 10*3/uL (ref 150–400)
RBC: 2.74 MIL/uL — ABNORMAL LOW (ref 3.87–5.11)
RDW: 13.7 % (ref 11.5–15.5)
WBC: 10.5 10*3/uL (ref 4.0–10.5)
nRBC: 0 % (ref 0.0–0.2)

## 2021-02-19 MED ORDER — FERROUS SULFATE 325 (65 FE) MG PO TABS
325.0000 mg | ORAL_TABLET | Freq: Every day | ORAL | Status: DC
  Administered 2021-02-19 – 2021-02-20 (×2): 325 mg via ORAL
  Filled 2021-02-19 (×2): qty 1

## 2021-02-19 NOTE — Lactation Note (Signed)
This note was copied from a baby's chart. Lactation Consultation Note  Patient Name: Girl Lashan Tellefsen Today's Date: 02/19/2021 Reason for consult: Initial assessment;Term Age:35 hours  Initial visit to 20 hours old infant of a P5 mother. It is mother's goal to breastfeed this baby. Mother states she has breastfed for only a few days after delivery her 4 children.  Mother reports a good latch and denies any pain/discomfort with latch. Baby just finished breastfeeding ~25 minutes, per mother. LC did not observe latch.  Baby is still cueing. LC demonstrated hand expression, collected ~4-mL and spoonfed.  Discussed normal newborn behavior and patterns, signs of good milk transfer, hunger cues, tummy size and benefits of skin to skin.   Plan: 1-Breastfeeding on demand or 8-12 times in 24h period. 2-Encouraged maternal rest, hydration and food intake.   Contact LC as needed for feeds/support/concerns/questions. All questions answered at this time. Provided Lactation services brochure and promoted [employer] booklet information.     Maternal Data Has patient been taught Hand Expression?: Yes Does the patient have breastfeeding experience prior to this delivery?: Yes How long did the patient breastfeed?: a few days after delivery x4  Feeding Mother's Current Feeding Choice: Breast Milk  Interventions Interventions: Breast feeding basics reviewed;Skin to skin;Breast massage;Hand express;Expressed milk;Education;LC Services brochure  Discharge Pump: Manual  Consult Status Consult Status: Follow-up Date: 02/20/21 Follow-up type: In-patient    Carleena Mires A Higuera Ancidey 02/19/2021, 4:16 PM

## 2021-02-19 NOTE — Progress Notes (Signed)
Patient is eating, ambulating, voiding.  Pain control is good.  Appropriate lochia, no complaints.  Vitals:   02/18/21 2255 02/19/21 0014 02/19/21 0438 02/19/21 0830  BP: 130/73 126/76 106/64 (!) 110/56  Pulse: 98 91 79 89  Resp: 18 18 18    Temp: 99.1 F (37.3 C) 97.9 F (36.6 C) 98.1 F (36.7 C) 99.3 F (37.4 C)  TempSrc: Oral Oral Oral Oral  SpO2: 99% 100% 100% 98%  Weight:      Height:        Fundus firm Abd soft nontender Ext: no calf tenderness  Lab Results  Component Value Date   WBC 10.5 02/19/2021   HGB 8.9 (L) 02/19/2021   HCT 26.2 (L) 02/19/2021   MCV 95.6 02/19/2021   PLT 174 02/19/2021    --/--/A POS (10/13 02-02-1978)  A/P Post partum day 1 Occ mild BP, otherwise normal, will monitor. Mild acute blood loss anemia, continue PNV and one add'l Fe at home  Routine care.  Expect d/c 10/15    11/15

## 2021-02-19 NOTE — Anesthesia Postprocedure Evaluation (Signed)
Anesthesia Post Note  Patient: Penny Bartlett  Procedure(s) Performed: AN AD HOC LABOR EPIDURAL     Patient location during evaluation: Mother Baby Anesthesia Type: Epidural Level of consciousness: awake and alert Pain management: pain level controlled Vital Signs Assessment: post-procedure vital signs reviewed and stable Respiratory status: spontaneous breathing, nonlabored ventilation and respiratory function stable Cardiovascular status: stable Postop Assessment: no headache, no backache, epidural receding, no apparent nausea or vomiting, patient able to bend at knees, adequate PO intake and able to ambulate Anesthetic complications: no   No notable events documented.  Last Vitals:  Vitals:   02/19/21 0438 02/19/21 0830  BP: 106/64 (!) 110/56  Pulse: 79 89  Resp: 18   Temp: 36.7 C 37.4 C  SpO2: 100% 98%    Last Pain:  Vitals:   02/19/21 0830  TempSrc: Oral  PainSc:    Pain Goal:                   [employer]

## 2021-02-20 MED ORDER — IBUPROFEN 800 MG PO TABS: 800.0000 mg | ORAL_TABLET | Freq: Three times a day (TID) | ORAL | 1 refills | Status: DC | PRN

## 2021-02-20 MED ORDER — FERROUS SULFATE 325 (65 FE) MG PO TABS: 325.0000 mg | ORAL_TABLET | Freq: Every day | ORAL | 3 refills | Status: DC

## 2021-02-20 NOTE — Lactation Note (Signed)
This note was copied from a baby's chart. Lactation Consultation Note  Patient Name: Penny Bartlett Today's Date: 02/20/2021 Reason for consult: Follow-up assessment;Infant weight loss Age:35 hours  I observed Mom pump on her R breast to assess flange size: size 21 flanges are appropriate for now. Mom has a [employer]inoh pump at home; Mom is aware she will likely need to order size 21 flanges for that pump. Mom's L nipple is intact; her R nipple is intact, but does have a couple of small red marks on nipple surface.   Mom will pump whenever infant gets formula. Mom has our # to reach for post-discharge questions. Parents' questions answered to their satisfaction.   Lurline Hare [facility] 02/20/2021, 11:00 AM

## 2021-02-20 NOTE — Progress Notes (Signed)
Patient is eating, ambulating, voiding.  Pain control is good.  Appropriate lochia, no complaints.  Vitals:   02/19/21 1500 02/19/21 2020 02/19/21 2209 02/20/21 0501  BP: 126/83 126/89 127/85 113/74  Pulse: 94 96 83 66  Resp: 18 16  16   Temp: 97.7 F (36.5 C) 98.6 F (37 C)  98.1 F (36.7 C)  TempSrc: Oral Oral  Oral  SpO2:  98%  98%  Weight:      Height:        Fundus firm Abd soft nontender Ext: no calf tenderness  Lab Results  Component Value Date   WBC 10.5 02/19/2021   HGB 8.9 (L) 02/19/2021   HCT 26.2 (L) 02/19/2021   MCV 95.6 02/19/2021   PLT 174 02/19/2021    --/--/A POS (10/13 02-02-1978)  A/P Post partum day 2 Normotensive >24hrs Mild acute blood loss anemia, continue PNV and one add'l Fe at home  Routine care.  DC home today  [name]

## 2021-02-20 NOTE — Discharge Summary (Signed)
Postpartum Discharge Summary  Date of Service updated     Patient Name: Penny Bartlett DOB: February 16, 1986 MRN: [id number]  Date of admission: 02/18/2021 Delivery date:02/18/2021  Delivering provider: Derl Barrow E  Date of discharge: 02/20/2021  Admitting diagnosis: Advanced maternal age in multigravida [O09.529] Intrauterine pregnancy: [redacted]w[redacted]d     Secondary diagnosis:  Active Problems:   Advanced maternal age in multigravida  Additional problems: bibe    Discharge diagnosis: Term Pregnancy Delivered                                              Post partum procedures: N/A Augmentation: AROM and Pitocin Complications: None  Hospital course: Induction of Labor With Vaginal Delivery   35 y.o. yo [id number] at [redacted]w[redacted]d was admitted to the hospital 02/18/2021 for induction of labor.  Indication for induction: Favorable cervix at term.  Patient had an uncomplicated labor course as follows: Membrane Rupture Time/Date: 4:53 PM ,02/18/2021   Delivery Method:Vaginal, Spontaneous  Episiotomy: None  Lacerations:  None  Details of delivery can be found in separate delivery note.  Patient had a routine postpartum course. Patient is discharged home 02/20/21.  Newborn Data: Birth date:02/18/2021  Birth time:7:58 PM  Gender:Female  Living status:Living  Apgars:8 ,9  Weight:3799 g   Physical exam  Vitals:   02/19/21 1500 02/19/21 2020 02/19/21 2209 02/20/21 0501  BP: 126/83 126/89 127/85 113/74  Pulse: 94 96 83 66  Resp: 18 16  16   Temp: 97.7 F (36.5 C) 98.6 F (37 C)  98.1 F (36.7 C)  TempSrc: Oral Oral  Oral  SpO2:  98%  98%  Weight:      Height:       General: alert, cooperative, and no distress Lochia: appropriate Uterine Fundus: firm Incision: N/A DVT Evaluation: No evidence of DVT seen on physical exam. Negative Homan's sign. No cords or calf tenderness. Labs: Lab Results  Component Value Date   WBC 10.5 02/19/2021   HGB 8.9 (L) 02/19/2021   HCT 26.2 (L)  02/19/2021   MCV 95.6 02/19/2021   PLT 174 02/19/2021   No flowsheet data found. Edinburgh Score: Edinburgh Postnatal Depression Scale Screening Tool 02/19/2021  I have been able to laugh and see the funny side of things. 0  I have looked forward with enjoyment to things. 0  I have blamed myself unnecessarily when things went wrong. 0  I have been anxious or worried for no good reason. 0  I have felt scared or panicky for no good reason. 0  Things have been getting on top of me. 0  I have been so unhappy that I have had difficulty sleeping. 0  I have felt sad or miserable. 0  I have been so unhappy that I have been crying. 0  The thought of harming myself has occurred to me. 0  Edinburgh Postnatal Depression Scale Total 0      After visit meds:  Allergies as of 02/20/2021   No Known Allergies      Medication List     STOP taking these medications    omeprazole 20 MG capsule Commonly known as: PRILOSEC       TAKE these medications    calcium carbonate 500 MG chewable tablet Commonly known as: TUMS - dosed in mg elemental calcium Chew 1 tablet by mouth daily as needed for  indigestion or heartburn.   ferrous sulfate 325 (65 FE) MG tablet Take 1 tablet (325 mg total) by mouth daily with breakfast. Start taking on: February 21, 2021   ibuprofen 800 MG tablet Commonly known as: ADVIL Take 1 tablet (800 mg total) by mouth every 8 (eight) hours as needed.   PRENATAL GUMMIES PO Take by mouth.         Discharge home in stable condition Infant Feeding: Breast Infant Disposition:home with mother Discharge instruction: per After Visit Summary and Postpartum booklet. Activity: Advance as tolerated. Pelvic rest for 6 weeks.  Diet: routine diet Anticipated Birth Control: Unsure Postpartum Appointment:6 weeks    02/20/2021 Carlisle Cater, MD

## 2021-02-20 NOTE — Lactation Note (Signed)
This note was copied from a baby's chart. Lactation Consultation Note  Patient Name: Penny Bartlett Today's Date: 02/20/2021 Reason for consult: Follow-up assessment;Infant weight loss Age:35 hours  Mom assisted with latching during consult. No signs of milk transfer noted. Discussion had with parents. Mom amenable to using formula in a bottle with a slow-flow nipple. The Similac slow-flow nipple was a little bit too fast, but infant did well with pacing (a slower flow nipple was not available at that time). Parents have Dr. Theora Gianotti bottles at home that includes a level 1 nipple. I informed parents that if the Level 1 nipple seems too fast, "Chloe" may benefit from Dr. Theora Gianotti newborn/transitional nipple.  Mom is currently eating breakfast, but will call for me to return so that I can assess flange size for her manual pump and teach her a different hand expression technique.   Lurline Hare [facility] 02/20/2021, 9:16 AM

## 2021-03-02 ENCOUNTER — Telehealth (HOSPITAL_COMMUNITY): Payer: Self-pay

## 2021-03-02 NOTE — Telephone Encounter (Addendum)
"  I'm good." Patient declines any questions or concerns about her healing.  "She's good. She is a good baby. She is gaining weight. She sleeps in the bassinet part of her pack n play." RN reviewed ABC's of safe sleep with patient. Patient declines any questions or concerns about baby.  EPDS score is 0.  Marcelino Duster [facility] 03/02/2021,1840

## 2021-11-24 ENCOUNTER — Ambulatory Visit (INDEPENDENT_AMBULATORY_CARE_PROVIDER_SITE_OTHER): Payer: Medicaid Other | Admitting: Licensed Clinical Social Worker

## 2021-11-24 DIAGNOSIS — F4323 Adjustment disorder with mixed anxiety and depressed mood: Secondary | ICD-10-CM
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
## 2022-04-20 ENCOUNTER — Ambulatory Visit (HOSPITAL_BASED_OUTPATIENT_CLINIC_OR_DEPARTMENT_OTHER): Admit: 2022-04-20 | Payer: Medicaid Other | Admitting: Obstetrics and Gynecology

## 2022-04-20 ENCOUNTER — Encounter (HOSPITAL_BASED_OUTPATIENT_CLINIC_OR_DEPARTMENT_OTHER): Payer: Self-pay

## 2022-04-20 DIAGNOSIS — Z01818 Encounter for other preprocedural examination: Secondary | ICD-10-CM

## 2022-04-20 SURGERY — SALPINGECTOMY, BILATERAL, LAPAROSCOPIC
Anesthesia: General | Laterality: Left

## 2022-07-18 ENCOUNTER — Encounter (HOSPITAL_COMMUNITY): Payer: Self-pay

## 2022-07-18 ENCOUNTER — Emergency Department (HOSPITAL_COMMUNITY): Payer: Medicaid Other

## 2022-07-18 ENCOUNTER — Emergency Department (HOSPITAL_COMMUNITY)
Admission: EM | Admit: 2022-07-18 | Discharge: 2022-07-18 | Disposition: A | Discharge status: Home / Self Care | Payer: Medicaid Other | Attending: Emergency Medicine | Admitting: Emergency Medicine

## 2022-07-18 DIAGNOSIS — R0789 Other chest pain: Secondary | ICD-10-CM | POA: Diagnosis not present

## 2022-07-18 DIAGNOSIS — S8011XA Contusion of right lower leg, initial encounter: Secondary | ICD-10-CM | POA: Diagnosis not present

## 2022-07-18 DIAGNOSIS — Y92410 Unspecified street and highway as the place of occurrence of the external cause: Secondary | ICD-10-CM | POA: Diagnosis not present

## 2022-07-18 DIAGNOSIS — M79604 Pain in right leg: Secondary | ICD-10-CM | POA: Diagnosis present

## 2022-07-18 MED ORDER — IBUPROFEN 400 MG PO TABS
600.0000 mg | ORAL_TABLET | Freq: Once | ORAL | Status: AC
  Administered 2022-07-18: 600 mg via ORAL
  Filled 2022-07-18: qty 1

## 2022-07-18 MED ORDER — ONDANSETRON 4 MG PO TBDP
4.0000 mg | ORAL_TABLET | Freq: Once | ORAL | Status: AC
  Administered 2022-07-18: 4 mg via ORAL
  Filled 2022-07-18: qty 1

## 2022-07-18 MED ORDER — NAPROXEN 375 MG PO TABS: 375.0000 mg | ORAL_TABLET | Freq: Two times a day (BID) | ORAL | 0 refills | Status: DC

## 2022-07-18 MED ORDER — LIDOCAINE 5 % EX PTCH: 1.0000 | MEDICATED_PATCH | CUTANEOUS | 0 refills | Status: DC
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
## 2022-08-16 ENCOUNTER — Other Ambulatory Visit: Payer: Self-pay

## 2022-08-16 ENCOUNTER — Emergency Department (HOSPITAL_COMMUNITY): Payer: Medicaid Other

## 2022-08-16 ENCOUNTER — Emergency Department (HOSPITAL_COMMUNITY)
Admission: EM | Admit: 2022-08-16 | Discharge: 2022-08-16 | Disposition: A | Discharge status: Home / Self Care | Payer: Medicaid Other | Attending: Emergency Medicine | Admitting: Emergency Medicine

## 2022-08-16 ENCOUNTER — Encounter (HOSPITAL_COMMUNITY): Payer: Self-pay

## 2022-08-16 DIAGNOSIS — M79661 Pain in right lower leg: Secondary | ICD-10-CM | POA: Diagnosis present

## 2022-08-16 DIAGNOSIS — M79604 Pain in right leg: Secondary | ICD-10-CM

## 2022-08-16 MED ORDER — DICLOFENAC SODIUM 1 % EX GEL
2.0000 g | Freq: Once | CUTANEOUS | Status: AC
  Administered 2022-08-16: 2 g via TOPICAL
  Filled 2022-08-16: qty 100

## 2022-08-16 NOTE — ED Notes (Signed)
Patient transported to X-ray 
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
## 2022-08-16 NOTE — Discharge Instructions (Signed)
As discussed, your evaluation today has been largely reassuring.  But, it is important that you monitor your condition carefully, and do not hesitate to return to the ED if you develop new, or concerning changes in your condition. ? ?Otherwise, please follow-up with your physician for appropriate ongoing care. ? ?
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
## 2022-11-16 IMAGING — US US MFM OB FOLLOW-UP
1 series · 14 of 28 positions shown · non-contrast
Comparison: none

[Series 1: us mfm ob follow-up · 14 of 87 slices shown]
[im 4/87]
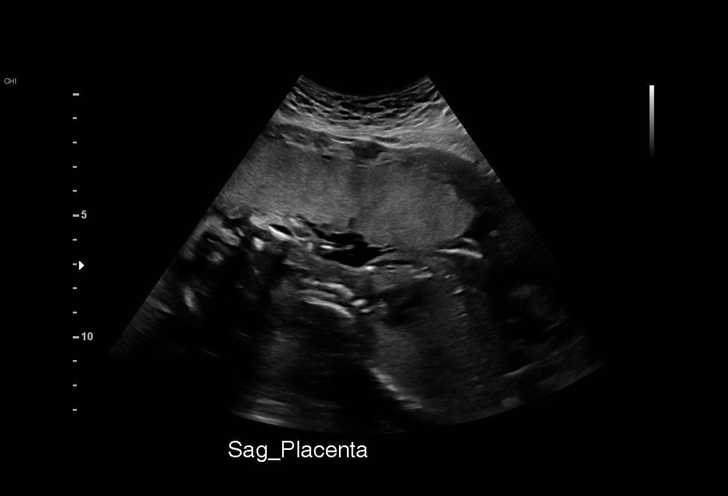
[im 10/87]
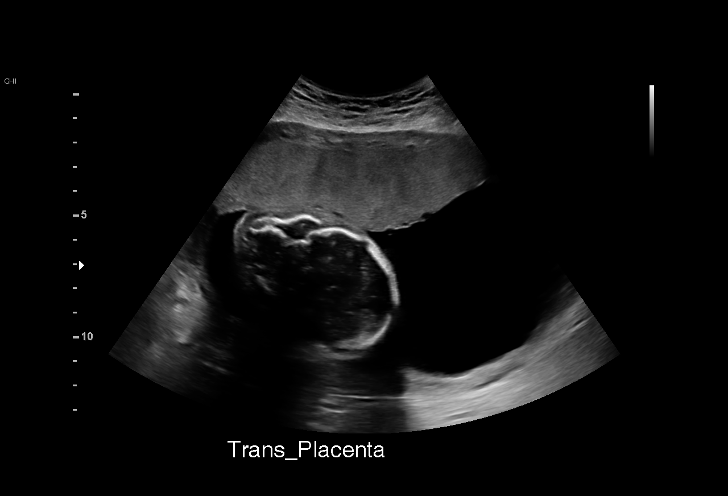
[im 16/87]
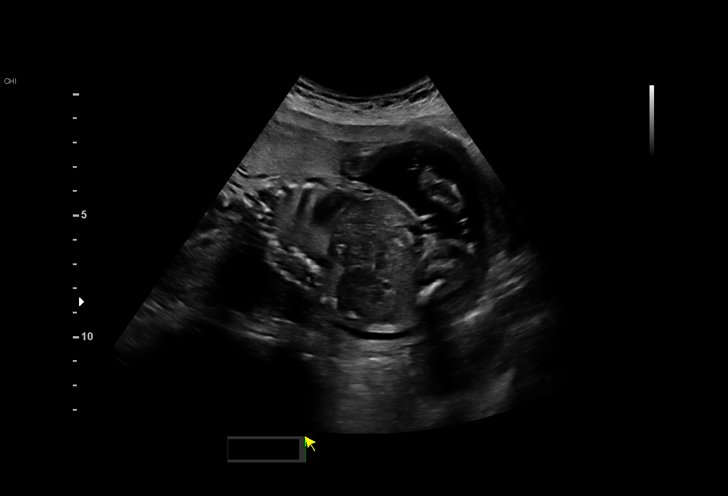
[im 23/87]
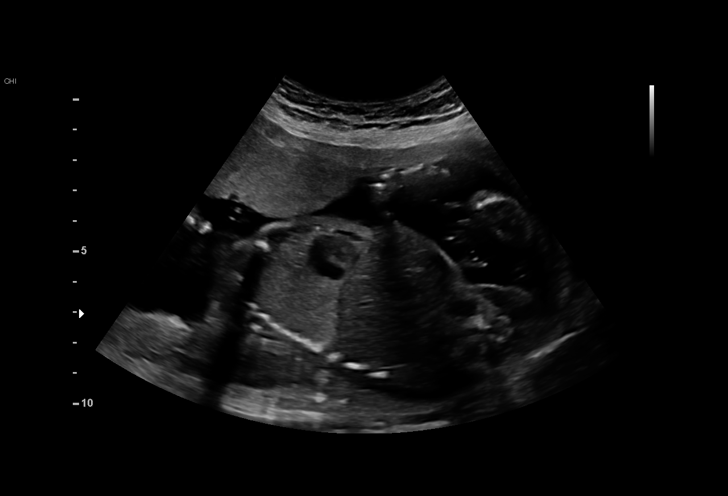
[im 29/87]
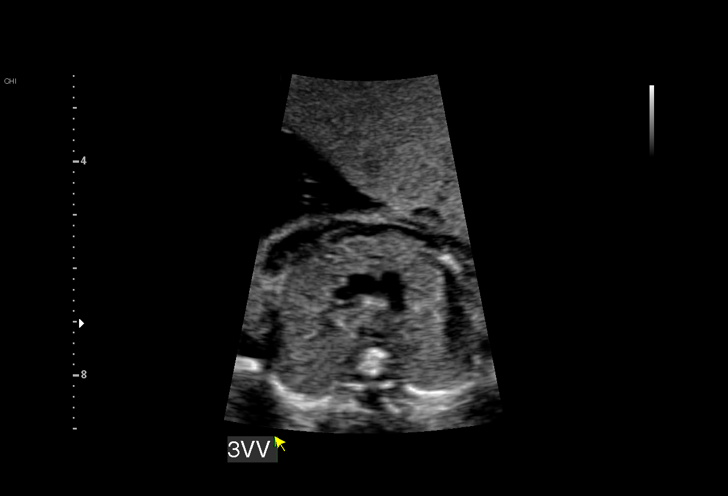
[im 36/87]
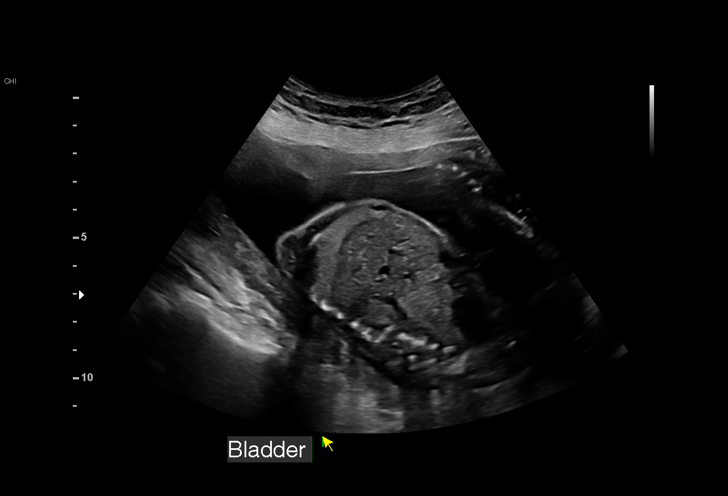
[im 42/87]
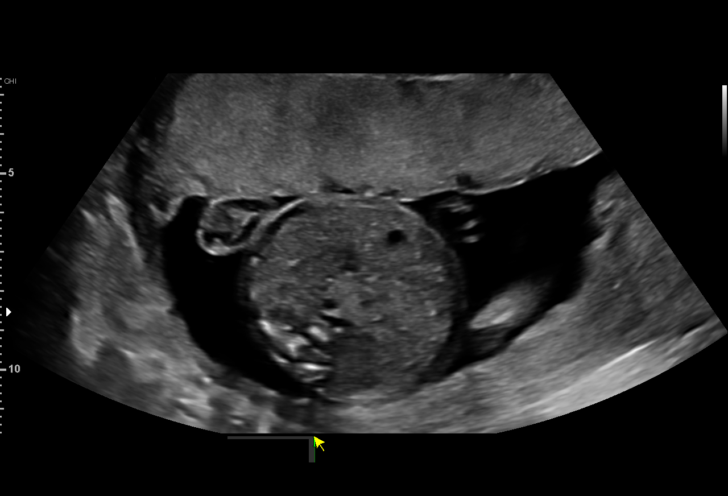
[im 48/87]
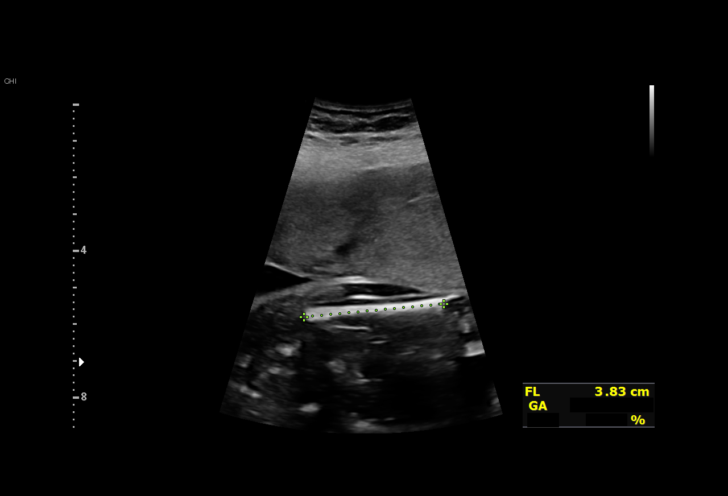
[im 55/87]
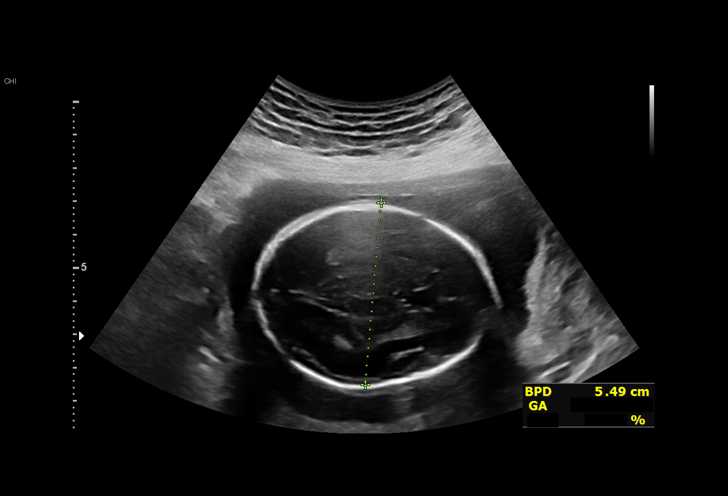
[im 61/87]
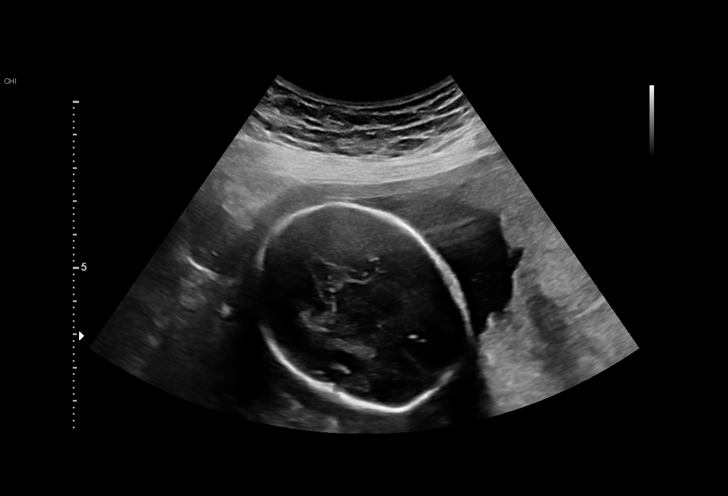
[im 67/87]
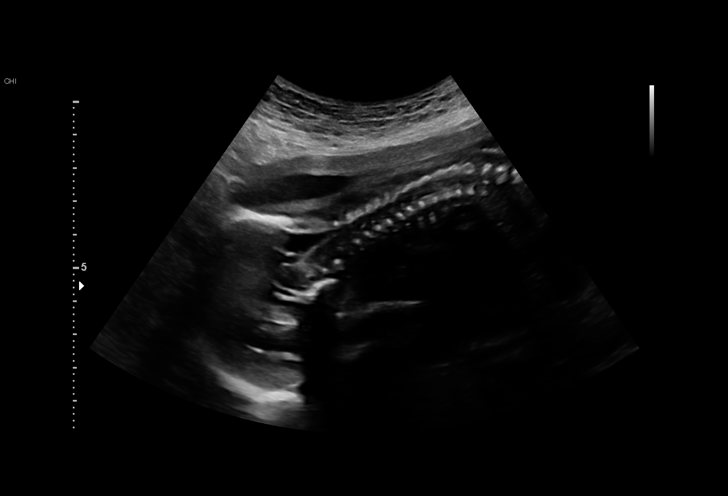
[im 74/87]
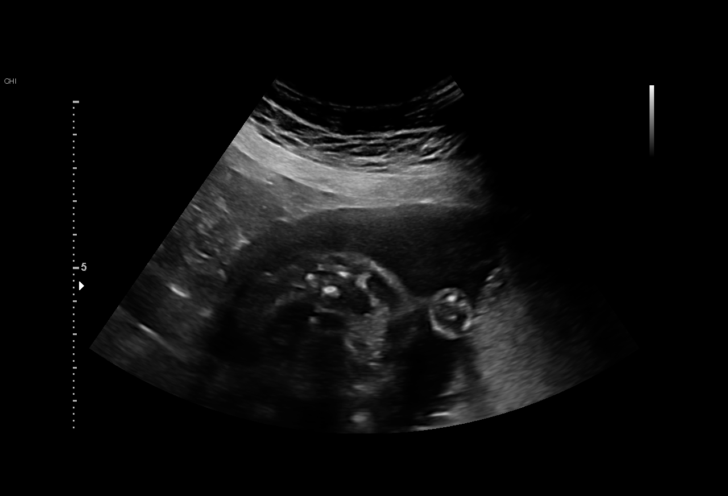
[im 80/87]
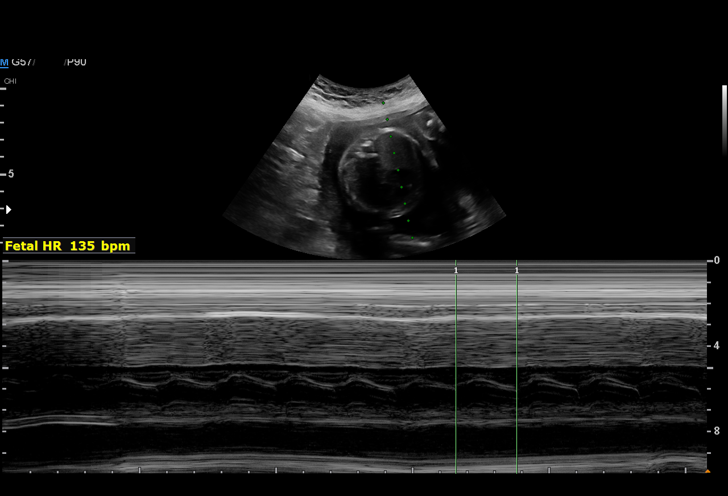
[im 87/87]
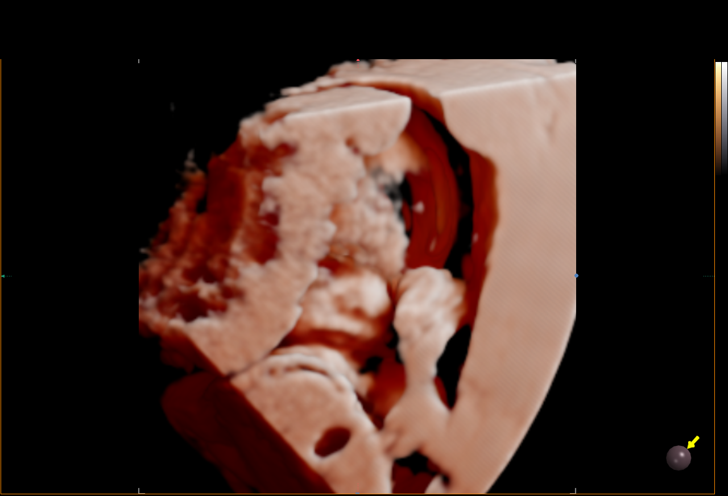

[14 of 28 positions shown; findings below may reference images not displayed]

OBGYN
                                                            [REDACTED]
                   [facility]

Indications

 Advanced maternal age multigravida 35+,
 second trimester
 Antenatal follow-up for nonvisualized fetal
 anatomy
 22 weeks gestation of pregnancy
 LR NIPS
Fetal Evaluation

 Num Of Fetuses:         1
 Fetal Heart Rate(bpm):  135
 Cardiac Activity:       Observed
 Presentation:           Variable
 Placenta:               Anterior
 P. Cord Insertion:      Visualized, central

 Amniotic Fluid
 AFI FV:      Within normal limits

                             Largest Pocket(cm)

Biometry

 BPD:      53.6  mm     G. Age:  22w 2d         25  %    CI:         69.4   %    70 - 86
                                                         FL/HC:      18.8   %    19.2 -
 HC:      205.4  mm     G. Age:  22w 5d         28  %    HC/AC:      1.17        1.05 -
 AC:      175.2  mm     G. Age:  22w 3d         28  %    FL/BPD:     72.0   %    71 - 87
 FL:       38.6  mm     G. Age:  22w 3d         24  %    FL/AC:      22.0   %    20 - 24

 LV:        5.2  mm
 Est. FW:     503  gm      1 lb 2 oz     23  %
OB History

 Blood Type:   A+
 Gravidity:    6         Term:   4        Prem:   0        SAB:   1
 TOP:          0       Ectopic:  0        Living: 4
Gestational Age

 LMP:           22w 6d        Date:  05/21/20                 EDD:   02/25/21
 U/S Today:     22w 3d                                        EDD:   02/28/21
 Best:          22w 6d     Det. By:  Early Ultrasound         EDD:   02/25/21
                                     (07/20/20)
Anatomy

 Cranium:               Appears normal         Aortic Arch:            Appears normal
 Cavum:                 Appears normal         Ductal Arch:            Appears normal
 Ventricles:            Appears normal         Diaphragm:              Appears normal
 Choroid Plexus:        Previously seen        Stomach:                Appears normal, left
                                                                       sided
 Cerebellum:            Previously seen        Abdomen:                Appears normal
 Posterior Fossa:       Previously seen        Abdominal Wall:         Previously seen
 Nuchal Fold:           Previously seen        Cord Vessels:           Previously seen
 Face:                  Orbits and profile     Kidneys:                Appear normal
                        previously seen
 Lips:                  Previously seen        Bladder:                Appears normal
 Thoracic:              Appears normal         Spine:                  Appears normal
 Heart:                 Appears normal         Upper Extremities:      Previously seen
                        (4CH, axis, and
                        situs)
 RVOT:                  Previously seen        Lower Extremities:      Previously seen
 LVOT:                  Appears normal

 Other:  Previously fetus appears to be female. Nasal bone and lenses
         previously visualized. Heels/feet and open hands/5th digits previously
         visualized. VC, 3VV and 3VTV visualized.
Cervix Uterus Adnexa

 Cervix
 Length:           3.29  cm.
 Normal appearance by transabdominal scan.
Impression

 Follow up growth due to incomplete fetal anatomy
 Normal interval growth with measurements consistent with
 dates
 Good fetal movement and amniotic fluid volume

 Anatomy complete today.
Recommendations

 Follow up as clinically indicated.

## 2023-05-08 ENCOUNTER — Ambulatory Visit
Admission: EM | Admit: 2023-05-08 | Discharge: 2023-05-08 | Disposition: A | Discharge status: Home / Self Care | Payer: Medicaid Other | Attending: Emergency Medicine | Admitting: Emergency Medicine

## 2023-05-08 DIAGNOSIS — B9789 Other viral agents as the cause of diseases classified elsewhere: Secondary | ICD-10-CM | POA: Diagnosis not present

## 2023-05-08 DIAGNOSIS — J069 Acute upper respiratory infection, unspecified: Secondary | ICD-10-CM | POA: Diagnosis not present

## 2023-05-08 DIAGNOSIS — J029 Acute pharyngitis, unspecified: Secondary | ICD-10-CM

## 2023-05-08 HISTORY — DX: Acute upper respiratory infection, unspecified: J06.9

## 2023-05-08 HISTORY — DX: Acute pharyngitis, unspecified: J02.9

## 2023-05-08 LAB — POCT RAPID STREP A (OFFICE): Rapid Strep A Screen: NEGATIVE

## 2023-05-08 MED ORDER — BENZONATATE 100 MG PO CAPS: 100.0000 mg | ORAL_CAPSULE | Freq: Three times a day (TID) | ORAL | 0 refills | Status: DC

## 2023-05-08 NOTE — ED Triage Notes (Signed)
Here with daughter. "Started Saturday night with sore throat, Cough, and now just not feeling well". "In the past month, COVID19 in house, Pna in house and now my turn to be sick". No fever known.

## 2023-05-08 NOTE — Discharge Instructions (Addendum)
Your strep test is negative. Most likely you have a viral illness: no antibiotic is indicated at this time, May treat with OTC meds of choice(chloraseptic throat lozenges,mucinex,etc). Take tessalon as directed for cough. Make sure to drink plenty of fluids to stay hydrated(gatorade, water, popsicles,jello,etc), avoid caffeine products. Follow up with PCP in 3 days if symptoms are not improving-call for appt. Return as needed.

## 2023-05-08 NOTE — ED Provider Notes (Signed)
[name]    CSN: [id number] Arrival date & time: 05/08/23  1405      History   Chief Complaint Chief Complaint  Patient presents with   Cough    Family of 2   Sore Throat   Hoarse    HPI Penny Bartlett is a 37 y.o. female.   37 year old female, Penny Bartlett, presents to urgent care for evaluation of sore throat, cough, voice change since Saturday night.  Patient states in the past month she has had pneumonia and  COVID in her house, no known fever, patient is using over-the-counter medication for symptom management.   The history is provided by the patient. No language interpreter was used.    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 Authorizing Provider  acetaminophen (TYLENOL) 325 MG tablet Take 650 mg by mouth every 6 (six) hours as needed.   Yes [provider]  benzonatate (TESSALON) 100 MG capsule Take 1 capsule (100 mg total) by mouth every 8 (eight) hours. 05/08/23  Yes Dempsey Knotek, Para March, NP  fluconazole (DIFLUCAN) 150 MG tablet Take 150 mg by mouth every 3 (three) days. 04/05/23  Yes [provider]  loratadine (CLARITIN) 10 MG  tablet Take 10 mg by mouth daily. 08/12/16  Yes [provider]  calcium carbonate (TUMS - DOSED IN MG ELEMENTAL CALCIUM) 500 MG chewable tablet Chew 1 tablet by mouth daily as needed for indigestion or heartburn.    [provider]  ferrous sulfate 325 (65 FE) MG tablet Take 1 tablet (325 mg total) by mouth daily with breakfast. 02/21/21   Shivaji, Valerie Roys, MD  ibuprofen (ADVIL) 800 MG tablet Take 1 tablet (800 mg total) by mouth every 8 (eight) hours as needed. 02/20/21   Shivaji, Valerie Roys, MD  lidocaine (LIDODERM) 5 % Place 1 patch onto the skin daily. Remove & Discard patch within 12 hours or as directed by MD 07/18/22   Marita Kansas, PA-C  naproxen (NAPROSYN) 375 MG tablet Take 1 tablet (375 mg total) by mouth 2 (two) times daily. 07/18/22   Marita Kansas, PA-C  Prenatal MV & Min w/FA-DHA (PRENATAL GUMMIES PO) Take by mouth.    [provider]    Family History Family History  Problem Relation Age of Onset   Diabetes Mother    Hypertension Mother  Social History Social History   Tobacco Use   Smoking status: Never    Passive exposure: Never   Smokeless tobacco: Never  Vaping Use   Vaping status: Never Used  Substance Use Topics   Alcohol use: Not Currently   Drug use: Never     Allergies   Patient has no known allergies.   Review of Systems Review of Systems  Constitutional:  Positive for activity change. Negative for fever.  HENT:  Positive for sore throat.   Respiratory:  Positive for cough.   All other systems reviewed and are negative.    Physical Exam Triage Vital Signs ED Triage Vitals  Encounter Vitals Group     BP      Systolic BP Percentile      Diastolic BP Percentile      Pulse      Resp      Temp      Temp src      SpO2      Weight      Height      Head Circumference      Peak Flow      Pain Score      Pain Loc      Pain Education      Exclude from Growth Chart    No data found.  Updated Vital Signs BP 110/74  (BP Location: Left Arm)   Pulse 75   Temp 98 F (36.7 C) (Oral)   Resp 18   Ht 5\' 6"  (1.676 m)   Wt 176 lb (79.8 kg)   LMP 04/18/2023 (Exact Date)   SpO2 97%   BMI 28.41 kg/m   Visual Acuity Right Eye Distance:   Left Eye Distance:   Bilateral Distance:    Right Eye Near:   Left Eye Near:    Bilateral Near:     Physical Exam Vitals and nursing note reviewed.  Constitutional:      General: She is not in acute distress.    Appearance: She is well-developed.  HENT:     Head: Normocephalic.     Right Ear: Tympanic membrane is retracted.     Left Ear: Tympanic membrane is retracted.     Nose: Congestion present.     Mouth/Throat:     Lips: Pink.     Mouth: Mucous membranes are moist.     Pharynx: Uvula midline. Posterior oropharyngeal erythema present.     Tonsils: No tonsillar exudate or tonsillar abscesses.  Eyes:     General: Lids are normal.     Conjunctiva/sclera: Conjunctivae normal.     Pupils: Pupils are equal, round, and reactive to light.  Neck:     Trachea: No tracheal deviation.  Cardiovascular:     Rate and Rhythm: Normal rate and regular rhythm.     Heart sounds: Normal heart sounds. No murmur heard. Pulmonary:     Effort: Pulmonary effort is normal.     Breath sounds: Normal breath sounds and air entry.     Comments: 97% on RA Abdominal:     General: Bowel sounds are normal.     Palpations: Abdomen is soft.     Tenderness: There is no abdominal tenderness.  Musculoskeletal:        General: Normal range of motion.     Cervical back: Normal range of motion.  Lymphadenopathy:     Cervical: No cervical adenopathy.  Skin:    General: Skin is warm and dry.  Findings: No rash.  Neurological:     General: No focal deficit present.     Mental Status: She is alert and oriented to person, place, and time.     GCS: GCS eye subscore is 4. GCS verbal subscore is 5. GCS motor subscore is 6.  Psychiatric:        Speech: Speech normal.        Behavior:  Behavior normal. Behavior is cooperative.      UC Treatments / Results  Labs (all labs ordered are listed, but only abnormal results are displayed) Labs Reviewed  CULTURE, GROUP A STREP [facility])  POCT RAPID STREP A (OFFICE)    EKG   Radiology No results found.  Procedures Procedures (including critical care time)  Medications Ordered in UC Medications - No data to display  Initial Impression / Assessment and Plan / UC Course  I have reviewed the triage vital signs and the nursing notes.  Pertinent labs & imaging results that were available during my care of the patient were reviewed by me and considered in my medical decision making (see chart for details).    Discussed exam findings and plan of care with patient, tessalon perles scripted,strep negative,culture pending, strict go to ER precautions given.   Patient verbalized understanding to this provider.  Ddx: Viral URI w cough,pharyngitis, allergies Final Clinical Impressions(s) / UC Diagnoses   Final diagnoses:  Sore throat  Viral upper respiratory illness     Discharge Instructions      Your strep test is negative. Most likely you have a viral illness: no antibiotic is indicated at this time, May treat with OTC meds of choice(chloraseptic throat lozenges,mucinex,etc). Take tessalon as directed for cough. Make sure to drink plenty of fluids to stay hydrated(gatorade, water, popsicles,jello,etc), avoid caffeine products. Follow up with PCP in 3 days if symptoms are not improving-call for appt. Return as needed.     ED Prescriptions     Medication Sig Dispense Auth. Provider   benzonatate (TESSALON) 100 MG capsule Take 1 capsule (100 mg total) by mouth every 8 (eight) hours. 21 capsule Torien Ramroop, Para March, NP      PDMP not reviewed this encounter.   Clancy Gourd, NP 05/08/23 [id number]

## 2023-05-10 NOTE — L&D Delivery Note (Signed)
 Delivery Note Penny Bartlett is a 38 y.o. [id number] now P6 who had a spontaneous delivery at [redacted]w[redacted]d Precipitous delivery at 02:33 of a viable female.  APGAR: 9 at 1 minute, and 9 at 5 minutes ; weight 4500 g (9# 14oz)  Admitted for eIOL. Induced with cytotec , pitocin , and AROM. Progressed normally. Received epidural for pain management. Patient 9.5 on RN exam, then began to vomit and delivered precipitously, delivered with RN present. Baby was delivered without difficulty. No nuchal cord.  Delayed cord clamping for 120 seconds.  Delivery of placenta was spontaneous. Placenta was found to be intact, 3 -vessel cord was noted. The fundus was found to be firm. Bilateral periurethral abrasions. Estimated blood loss 100cc. Cord gases were not sent. Instrument and gauze counts were correct at the end of the procedure. Fundus was firm, bleeding was minimal,  and both mother and baby were doing well when I left the room.  Placenta status: to L&D   Anesthesia:  Epidural Episiotomy:  None Lacerations:  Periurethral abrasion Est. Blood Loss (mL):  100 cc  Mom to postpartum.  Baby to Couplet care / Skin to Skin.  [name] 03/19/2024, 2:51 AM

## 2023-05-15 LAB — CULTURE, GROUP A STREP (THRC)

## 2023-08-17 LAB — OB RESULTS CONSOLE GC/CHLAMYDIA
Chlamydia: NEGATIVE
Neisseria Gonorrhea: NEGATIVE

## 2023-08-31 LAB — OB RESULTS CONSOLE HIV ANTIBODY (ROUTINE TESTING): HIV: NONREACTIVE

## 2023-08-31 LAB — OB RESULTS CONSOLE HEPATITIS B SURFACE ANTIGEN: Hepatitis B Surface Ag: NEGATIVE

## 2023-08-31 LAB — OB RESULTS CONSOLE RUBELLA ANTIBODY, IGM: Rubella: IMMUNE

## 2023-08-31 LAB — HEPATITIS C ANTIBODY: HCV Ab: NEGATIVE

## 2023-08-31 LAB — OB RESULTS CONSOLE RPR: RPR: NONREACTIVE

## 2023-09-28 ENCOUNTER — Other Ambulatory Visit: Payer: Self-pay | Admitting: Obstetrics and Gynecology

## 2023-09-28 ENCOUNTER — Other Ambulatory Visit: Payer: Self-pay

## 2023-09-28 ENCOUNTER — Telehealth: Payer: Self-pay

## 2023-09-28 DIAGNOSIS — O09529 Supervision of elderly multigravida, unspecified trimester: Secondary | ICD-10-CM

## 2023-10-26 DIAGNOSIS — Z141 Cystic fibrosis carrier: Secondary | ICD-10-CM | POA: Insufficient documentation

## 2023-10-26 DIAGNOSIS — O99210 Obesity complicating pregnancy, unspecified trimester: Secondary | ICD-10-CM | POA: Insufficient documentation

## 2023-11-06 ENCOUNTER — Other Ambulatory Visit: Payer: Self-pay | Admitting: *Deleted

## 2023-11-06 ENCOUNTER — Ambulatory Visit (HOSPITAL_BASED_OUTPATIENT_CLINIC_OR_DEPARTMENT_OTHER): Admitting: Obstetrics and Gynecology

## 2023-11-06 ENCOUNTER — Ambulatory Visit (HOSPITAL_BASED_OUTPATIENT_CLINIC_OR_DEPARTMENT_OTHER)

## 2023-11-06 ENCOUNTER — Ambulatory Visit: Attending: Obstetrics and Gynecology

## 2023-11-06 VITALS — BP 122/67 | HR 77

## 2023-11-06 DIAGNOSIS — O26892 Other specified pregnancy related conditions, second trimester: Secondary | ICD-10-CM | POA: Diagnosis not present

## 2023-11-06 DIAGNOSIS — E669 Obesity, unspecified: Secondary | ICD-10-CM

## 2023-11-06 DIAGNOSIS — O99212 Obesity complicating pregnancy, second trimester: Secondary | ICD-10-CM

## 2023-11-06 DIAGNOSIS — Z148 Genetic carrier of other disease: Secondary | ICD-10-CM | POA: Insufficient documentation

## 2023-11-06 DIAGNOSIS — O09522 Supervision of elderly multigravida, second trimester: Secondary | ICD-10-CM

## 2023-11-06 DIAGNOSIS — O99210 Obesity complicating pregnancy, unspecified trimester: Secondary | ICD-10-CM

## 2023-11-06 DIAGNOSIS — O09529 Supervision of elderly multigravida, unspecified trimester: Secondary | ICD-10-CM

## 2023-11-06 DIAGNOSIS — Z3A20 20 weeks gestation of pregnancy: Secondary | ICD-10-CM

## 2023-11-06 DIAGNOSIS — Z141 Cystic fibrosis carrier: Secondary | ICD-10-CM | POA: Insufficient documentation

## 2023-11-06 DIAGNOSIS — Z362 Encounter for other antenatal screening follow-up: Secondary | ICD-10-CM

## 2023-11-06 NOTE — Progress Notes (Signed)
 [facility] at [facility] [address] Phone:  [phone]   Fax:  [phone]      In-Person Genetic Counseling Clinic Note:   I spoke with 38 y.o. Penny Bartlett today to discuss her carrier screening results. She was referred by Diedre Rosaline BRAVO, MD. She was accompanied by her husband Medford.   Pregnancy History:    H2E4984. EGA: [redacted]w[redacted]d by LMP. EDD: 03/25/2024. Patient reports one early miscarriage, no known genetic or health concerns. Reports she takes PNVs and citalopram. Denies personal history of diabetes, high blood pressure, thyroid conditions, and seizures. Denies bleeding, infections, and fevers in this pregnancy. Denies using tobacco, alcohol, or street drugs in this pregnancy.   Family History:    A three-generation pedigree was created and scanned into Epic under the Media tab.  Patient reports her paternal half-sister has a 55 yo son with autism. No other information is known. We discussed that we are unable to directly test for autism in a pregnancy. Genetic testing for individuals with a clinical diagnosis of autism yields an explanation in only about 20% of cases, and the remaining 80% of cases are left with unknown etiology. Having an affected family member may increase the chance that Garland's children will have autism; however, without genetic testing performed on affected family members, it is difficult to assess risk to the pregnancy and other family members. The risk may be up to 50% in the case of an identified genetic cause. Patient's fragile X carrier screening was negative.  FOB reports his 29 yo daughter had concern for a heart murmur between ages 80 and 20. She is doing well now. We reviewed that heart murmurs can be isolated or a feature of an underlying genetic condition. Congenital heart defects are most often multifactorial in etiology, but can also result from chromosome aberrations, single gene  conditions, or teratogenic exposures. We discussed that isolated, nonsyndromic CHDs occur in approximately 1% of the general population. Recurrence risk for half-siblings is not expected to greatly be elevated over the general population risk.    The couple is encouraged to reach out with any additional information if available.  Maternal ethnicity reported as White and paternal ethnicity reported as White. Denies Ashkenazi Jewish ancestry.  Family history not remarkable for consanguinity, individuals with birth defects, intellectual disability, autism spectrum disorder, multiple spontaneous abortions, still births, or unexplained neonatal death.   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 She was not found to be a carrier for the other 12 conditions screened for. This significantly reduces but does not eliminate the chance of being a carrier. Please see report for details.   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 This has variable expressivity, with some individuals developing CF if this is combined with another CF-causing variant while others may be asymptomatic.   We reviewed genes, autosomal recessive inheritance of CF, and the clinical features of CF. We reviewed there would be a 25% chance the pregnancy would be affected if FOB is also found to be a carrier.  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  We reviewed genes, autosomal recessive inheritance of ARPKD, and the clinical features of ARPKD. We reviewed there would be a 25% chance the pregnancy would be affected if FOB is also  found to be a carrier.  FOB carrier screening offered: We reviewed there would be a 25% chance the pregnancy would be affected with each condition if FOB is also found to be a carrier. If both members of a couple are known to be carriers, diagnostic testing through amniocentesis is available to determine if the pregnancy is affected. The benefits, risks, and limitations of amniocentesis including the 1 in 500 risk for amniocentesis were reviewed. We also discussed that testing can be completed postnatally. CF is included on [place] 's Newborn Screening Program. The newborn screening test utilizes trypsinogen levels rather than genetic testing to detect affected individuals whereas genetic testing can determine the specific variants, if any, that a child may have inherited. Therefore, the couple may also seek postnatal genetic counseling and testing if needed or desired.   Given these results, we discussed and offered carrier screening for Saniah's reproductive partner. We reviewed the benefits and limitations of carrier screening and that it can detect most but not all carriers. The couple consented carrier screening for CF and ARPKD. His blood was drawn during today's visit. He consented that we call Amyre with the results.  In the meantime, we calculated the risk of ARPKD to the pregnancy based on our current knowledge of Taeko's test results and FOB's ethnicity. The chance that the fetus would be affected with ARPKD would be 1 in 400 (0.25%).   We also calculated the risk of CF to the pregnancy based on our current knowledge of Aarti's test results and FOB's ethnicity. The chance that the fetus would be affected with ARPKD would be 1 in 100 (1%). Carrier screening for FOB will allow us  to provide a more accurate risk to the fetus.    Newborn Screening. The [place]  Newborn Screening (NBS) program will screen all newborn babies for cystic fibrosis, spinal muscular atrophy, hemoglobinopathies,  and numerous other conditions.  Advanced Maternal Age:  The chance that Maggy's current pregnancy would be affected with a chromosome difference based on her age alone is approximately 1 in 105 (~1%). This means that there is a 104 in 105 (~99%) chance that the fetus would not be affected with a chromosome difference. This risk may also be lower given her low-risk NIPS results and normal anatomy ultrasound.  Previous Testing Completed:  Low risk NIPS: Syvilla previously completed noninvasive prenatal screening (NIPS) in this pregnancy. The result is low risk, consistent with a female fetus. This screening significantly reduces but does not eliminate the chance that the current pregnancy has Down syndrome (trisomy 25), trisomy 29, trisomy 21, common sex chromosome conditions, and 22q11.2 microdeletion syndrome. Please see report for details. There are many genetic conditions that cannot be detected by NIPS.    Plan of Care:   Carrier screening for FOB was drawn today for cystic fibrosis and autosomal recessive polycystic kidney disease. He consented we call the patient with the results. Patient declined amniocentesis. Routine prenatal care.   Informed consent was obtained. All questions were answered.   70 minutes were spent on the date of the encounter in service to the patient including preparation, face-to-face consultation, discussion of test reports and available next steps, pedigree construction, genetic risk assessment, documentation, and care coordination.    Thank you for sharing in the care of Griselda  with us .  Please do not hesitate to contact us  at [phone] if you have any questions.   Lauraine Bodily, MS, [facility] Certified Genetic Counselor   Genetic counseling student involved in appointment: No.

## 2023-11-06 NOTE — Progress Notes (Signed)
 Maternal-Fetal Medicine Consultation Name: Penny Bartlett MRN: [id number]  G7 P5015 at 20-weeks' gestation.  Patient is here for fetal anatomy scan. - Advanced maternal age (AMA).  On cell free fetal DNA screening, the risks of fetal aneuploidies are not increased.  Ultrasound We performed fetal anatomy scan. No makers of aneuploidies or fetal structural defects are seen. Fetal biometry is consistent with her previously-established dates. Amniotic fluid is normal and good fetal activity is seen. Patient understands the limitations of ultrasound in detecting fetal anomalies.   I discussed the significance and limitations of cell free fetal DNA screening that has a greater detection rate for Down syndrome.  It does not, however, detect all chromosomal anomalies.  I counseled the patient that only amniocentesis will give a definitive result on the fetal karyotype.  Amniocentesis is associated with a small risk of miscarriage (1 and 500 procedures).  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  Her previous child is from a current partner, and he has not been screened. I briefly discussed genetics and the importance of partner screening. The couple met with our genetic counselor and her partner had his blood drawn today for carrier screening.  Will communicate the results to the couple. You will be receiving a separate letter from our genetic counselor     Recommendations -An appointment was made for her to return in 12 weeks for fetal growth assessment (AMA).  Consultation including face-to-face (more than 50%) counseling 15 minutes.

## 2023-11-21 ENCOUNTER — Telehealth: Payer: Self-pay

## 2023-11-21 NOTE — Telephone Encounter (Signed)
 Attempted to call patient and partner to return his negative carrier screening results. Left message with callback number.

## 2023-11-21 NOTE — Telephone Encounter (Signed)
 FOB Lonni Ann DOB 10/23/1985 called back. He was not found to be a carrier for cystic fibrosis or autosomal recessive polycystic kidney disease. Please see report for details. A negative result on carrier screening reduces but does not eliminate the chance of being a carrier. The chance this couple's current and future pregnancies would be affected with these conditions is very low.  Lauraine Bodily, MS, [facility] Certified Genetic Counselor [facility] [phone]

## 2024-01-29 ENCOUNTER — Ambulatory Visit

## 2024-01-29 ENCOUNTER — Other Ambulatory Visit

## 2024-03-06 LAB — OB RESULTS CONSOLE GBS: GBS: POSITIVE

## 2024-03-18 ENCOUNTER — Other Ambulatory Visit: Payer: Self-pay

## 2024-03-18 ENCOUNTER — Encounter (HOSPITAL_COMMUNITY): Payer: Self-pay

## 2024-03-18 ENCOUNTER — Inpatient Hospital Stay (HOSPITAL_COMMUNITY): Admitting: Anesthesiology

## 2024-03-18 ENCOUNTER — Inpatient Hospital Stay (HOSPITAL_COMMUNITY)
Admission: RE | Admit: 2024-03-18 | Discharge: 2024-03-20 | DRG: 807 | Disposition: A | Discharge status: Home / Self Care

## 2024-03-18 DIAGNOSIS — K219 Gastro-esophageal reflux disease without esophagitis: Secondary | ICD-10-CM | POA: Diagnosis present

## 2024-03-18 DIAGNOSIS — Z8249 Family history of ischemic heart disease and other diseases of the circulatory system: Secondary | ICD-10-CM | POA: Diagnosis not present

## 2024-03-18 DIAGNOSIS — O9962 Diseases of the digestive system complicating childbirth: Secondary | ICD-10-CM | POA: Diagnosis present

## 2024-03-18 DIAGNOSIS — Z3A39 39 weeks gestation of pregnancy: Secondary | ICD-10-CM

## 2024-03-18 DIAGNOSIS — O134 Gestational [pregnancy-induced] hypertension without significant proteinuria, complicating childbirth: Principal | ICD-10-CM | POA: Diagnosis present

## 2024-03-18 DIAGNOSIS — Z141 Cystic fibrosis carrier: Secondary | ICD-10-CM | POA: Diagnosis not present

## 2024-03-18 DIAGNOSIS — Z833 Family history of diabetes mellitus: Secondary | ICD-10-CM | POA: Diagnosis not present

## 2024-03-18 DIAGNOSIS — O26893 Other specified pregnancy related conditions, third trimester: Secondary | ICD-10-CM | POA: Diagnosis present

## 2024-03-18 DIAGNOSIS — O99344 Other mental disorders complicating childbirth: Secondary | ICD-10-CM | POA: Diagnosis present

## 2024-03-18 DIAGNOSIS — Z3490 Encounter for supervision of normal pregnancy, unspecified, unspecified trimester: Principal | ICD-10-CM

## 2024-03-18 DIAGNOSIS — F32A Depression, unspecified: Secondary | ICD-10-CM | POA: Diagnosis present

## 2024-03-18 DIAGNOSIS — F419 Anxiety disorder, unspecified: Secondary | ICD-10-CM | POA: Diagnosis present

## 2024-03-18 DIAGNOSIS — O99824 Streptococcus B carrier state complicating childbirth: Secondary | ICD-10-CM | POA: Diagnosis present

## 2024-03-18 DIAGNOSIS — O139 Gestational [pregnancy-induced] hypertension without significant proteinuria, unspecified trimester: Secondary | ICD-10-CM | POA: Insufficient documentation

## 2024-03-18 LAB — COMPREHENSIVE METABOLIC PANEL WITH GFR
ALT: 13 U/L (ref 0–44)
AST: 20 U/L (ref 15–41)
Albumin: 2.3 g/dL — ABNORMAL LOW (ref 3.5–5.0)
Alkaline Phosphatase: 75 U/L (ref 38–126)
Anion gap: 9 (ref 5–15)
BUN: 6 mg/dL (ref 6–20)
CO2: 22 mmol/L (ref 22–32)
Calcium: 8.8 mg/dL — ABNORMAL LOW (ref 8.9–10.3)
Chloride: 105 mmol/L (ref 98–111)
Creatinine, Ser: 0.61 mg/dL (ref 0.44–1.00)
GFR, Estimated: >60 mL/min (ref >60)
Glucose, Bld: 70 mg/dL (ref 70–99)
Potassium: 3.9 mmol/L (ref 3.5–5.1)
Sodium: 136 mmol/L (ref 135–145)
Total Bilirubin: 0.4 mg/dL (ref 0.0–1.2)
Total Protein: 5.6 g/dL — ABNORMAL LOW (ref 6.5–8.1)

## 2024-03-18 LAB — CBC
HCT: 31 % — ABNORMAL LOW (ref 36.0–46.0)
HCT: 31.5 % — ABNORMAL LOW (ref 36.0–46.0)
Hemoglobin: 10.4 g/dL — ABNORMAL LOW (ref 12.0–15.0)
Hemoglobin: 10.6 g/dL — ABNORMAL LOW (ref 12.0–15.0)
MCH: 31.4 pg (ref 26.0–34.0)
MCH: 31.6 pg (ref 26.0–34.0)
MCHC: 33.5 g/dL (ref 30.0–36.0)
MCHC: 33.7 g/dL (ref 30.0–36.0)
MCV: 93.7 fL (ref 80.0–100.0)
MCV: 94 fL (ref 80.0–100.0)
Platelets: 250 K/uL (ref 150–400)
Platelets: 248 K/uL (ref 150–400)
RBC: 3.31 MIL/uL — ABNORMAL LOW (ref 3.87–5.11)
RBC: 3.35 MIL/uL — ABNORMAL LOW (ref 3.87–5.11)
RDW: 14.7 % (ref 11.5–15.5)
RDW: 14.8 % (ref 11.5–15.5)
WBC: 8.4 K/uL (ref 4.0–10.5)
WBC: 9.5 K/uL (ref 4.0–10.5)
nRBC: 0 % (ref 0.0–0.2)
nRBC: 0 % (ref 0.0–0.2)

## 2024-03-18 LAB — TYPE AND SCREEN
ABO/RH(D): A POS
Antibody Screen: NEGATIVE

## 2024-03-18 LAB — PROTEIN / CREATININE RATIO, URINE
Creatinine, Urine: 53 mg/dL
Protein Creatinine Ratio: 0.17 mg/mg{creat} — ABNORMAL HIGH (ref 0.00–0.15)
Total Protein, Urine: 9 mg/dL

## 2024-03-18 MED ORDER — OXYTOCIN-SODIUM CHLORIDE 30-0.9 UT/500ML-% IV SOLN: 2.5000 [IU]/h | INTRAVENOUS | Status: DC

## 2024-03-18 MED ORDER — LACTATED RINGERS IV SOLN: 500.0000 mL | INTRAVENOUS | Status: DC | PRN

## 2024-03-18 MED ORDER — LIDOCAINE HCL (PF) 1 % IJ SOLN
INTRAMUSCULAR | Status: DC | PRN
  Administered 2024-03-18 (×2): 4 mL via EPIDURAL

## 2024-03-18 MED ORDER — EPHEDRINE 5 MG/ML INJ: 10.0000 mg | INTRAVENOUS | Status: DC | PRN

## 2024-03-18 MED ORDER — OXYTOCIN-SODIUM CHLORIDE 30-0.9 UT/500ML-% IV SOLN
1.0000 m[IU]/min | INTRAVENOUS | Status: DC
  Administered 2024-03-18: 2 m[IU]/min via INTRAVENOUS
  Filled 2024-03-18: qty 500

## 2024-03-18 MED ORDER — ONDANSETRON HCL 4 MG/2ML IJ SOLN
4.0000 mg | Freq: Four times a day (QID) | INTRAMUSCULAR | Status: DC | PRN
  Administered 2024-03-18: 4 mg via INTRAVENOUS
  Filled 2024-03-18: qty 2

## 2024-03-18 MED ORDER — HYDROXYZINE HCL 50 MG PO TABS: 50.0000 mg | ORAL_TABLET | Freq: Four times a day (QID) | ORAL | Status: DC | PRN

## 2024-03-18 MED ORDER — MISOPROSTOL 25 MCG QUARTER TABLET
25.0000 ug | ORAL_TABLET | ORAL | Status: DC | PRN
  Administered 2024-03-18: 25 ug via VAGINAL
  Filled 2024-03-18 (×2): qty 1

## 2024-03-18 MED ORDER — LACTATED RINGERS IV SOLN: INTRAVENOUS | Status: DC

## 2024-03-18 MED ORDER — PHENYLEPHRINE 80 MCG/ML (10ML) SYRINGE FOR IV PUSH (FOR BLOOD PRESSURE SUPPORT): 80.0000 ug | PREFILLED_SYRINGE | INTRAVENOUS | Status: DC | PRN

## 2024-03-18 MED ORDER — FENTANYL CITRATE (PF) 100 MCG/2ML IJ SOLN: 50.0000 ug | INTRAMUSCULAR | Status: DC | PRN

## 2024-03-18 MED ORDER — DIPHENHYDRAMINE HCL 50 MG/ML IJ SOLN: 12.5000 mg | INTRAMUSCULAR | Status: DC | PRN

## 2024-03-18 MED ORDER — SOD CITRATE-CITRIC ACID 500-334 MG/5ML PO SOLN
30.0000 mL | ORAL | Status: DC | PRN
Start: 2024-03-18 — End: 2024-03-19

## 2024-03-18 MED ORDER — FENTANYL-BUPIVACAINE-NACL 0.5-0.125-0.9 MG/250ML-% EP SOLN
12.0000 mL/h | EPIDURAL | Status: DC | PRN
  Administered 2024-03-18: 12 mL/h via EPIDURAL
  Filled 2024-03-18: qty 250

## 2024-03-18 MED ORDER — SODIUM CHLORIDE 0.9 % IV SOLN
5.0000 10*6.[IU] | Freq: Once | INTRAVENOUS | Status: AC
  Administered 2024-03-18: 5 10*6.[IU] via INTRAVENOUS
  Filled 2024-03-18: qty 5

## 2024-03-18 MED ORDER — CITALOPRAM HYDROBROMIDE 20 MG PO TABS
20.0000 mg | ORAL_TABLET | Freq: Every day | ORAL | Status: DC
  Administered 2024-03-19 – 2024-03-20 (×2): 20 mg via ORAL
  Filled 2024-03-18 (×3): qty 1

## 2024-03-18 MED ORDER — CITALOPRAM HYDROBROMIDE 10 MG PO TABS
10.0000 mg | ORAL_TABLET | Freq: Every day | ORAL | Status: DC
  Filled 2024-03-18: qty 1

## 2024-03-18 MED ORDER — PENICILLIN G POT IN DEXTROSE 60000 UNIT/ML IV SOLN
3.0000 10*6.[IU] | INTRAVENOUS | Status: DC
  Administered 2024-03-18 – 2024-03-19 (×2): 3 10*6.[IU] via INTRAVENOUS
  Filled 2024-03-18 (×2): qty 50

## 2024-03-18 MED ORDER — LIDOCAINE HCL (PF) 1 % IJ SOLN: 30.0000 mL | INTRAMUSCULAR | Status: DC | PRN

## 2024-03-18 MED ORDER — ACETAMINOPHEN 325 MG PO TABS: 650.0000 mg | ORAL_TABLET | ORAL | Status: DC | PRN

## 2024-03-18 MED ORDER — OXYTOCIN BOLUS FROM INFUSION
333.0000 mL | Freq: Once | INTRAVENOUS | Status: AC
  Administered 2024-03-19: 333 mL via INTRAVENOUS

## 2024-03-18 MED ORDER — LACTATED RINGERS IV SOLN
500.0000 mL | Freq: Once | INTRAVENOUS | Status: AC
  Administered 2024-03-18: 500 mL via INTRAVENOUS

## 2024-03-18 MED ORDER — TERBUTALINE SULFATE 1 MG/ML IJ SOLN: 0.2500 mg | Freq: Once | INTRAMUSCULAR | Status: DC | PRN

## 2024-03-18 MED ORDER — PHENYLEPHRINE 80 MCG/ML (10ML) SYRINGE FOR IV PUSH (FOR BLOOD PRESSURE SUPPORT)
80.0000 ug | PREFILLED_SYRINGE | INTRAVENOUS | Status: DC | PRN
Start: 2024-03-18 — End: 2024-03-19

## 2024-03-18 NOTE — H&P (Signed)
 Penny Bartlett is a 38 y.o. female H2E4984 at 39w presenting for eIOL.  Patient is doing well. Notes intermittent mild contraction. Denies LOF or VB. Reports good fetal movement.    OB History     Gravida  7   Para  5   Term  5   Preterm      AB  1   Living  5      SAB  1   IAB      Ectopic      Multiple      Live Births             Past Medical History:  Diagnosis Date   Acne 07/01/2014   GERD (gastroesophageal reflux disease)    Sore throat 05/08/2023   Viral upper respiratory illness 05/08/2023   Past Surgical History:  Procedure Laterality Date   DILATION AND CURETTAGE OF UTERUS     HERNIA REPAIR     Umbilical   Family History: family history includes Diabetes in her mother; Hypertension in her mother. Social History:  reports that she has never smoked. She has never been exposed to tobacco smoke. She has never used smokeless tobacco. She reports that she does not currently use alcohol. She reports that she does not use drugs.     Maternal Diabetes: No Genetic Screening: Normal Maternal Ultrasounds/Referrals: None Fetal Ultrasounds or other Referrals:  Referred to Materal Fetal Medicine  Maternal Substance Abuse:  No Significant Maternal Medications:  None Significant Maternal Lab Results:  Group B Strep positive Number of Prenatal Visits:greater than 3 verified prenatal visits Maternal Vaccinations:TDap, declined Flu. Did not receive RSV   Review of Systems  Constitutional:  Negative for chills and fever.  Respiratory:  Negative for cough and shortness of breath.   Cardiovascular:  Negative for chest pain.  Gastrointestinal:  Negative for abdominal pain, diarrhea, nausea and vomiting.  Genitourinary:  Negative for dysuria and vaginal bleeding.  Skin:  Negative for rash.  Neurological:  Negative for syncope, weakness, numbness and headaches.     Exam Physical Exam Constitutional:      Appearance: Normal appearance.  HENT:     Head:  Normocephalic and atraumatic.     Mouth/Throat:     Mouth: Mucous membranes are moist.  Eyes:     Extraocular Movements: Extraocular movements intact.  Cardiovascular:     Rate and Rhythm: Normal rate.  Pulmonary:     Effort: Pulmonary effort is normal.     Breath sounds: Normal breath sounds.  Abdominal:     Comments: Gravid, non-tender  Musculoskeletal:        General: Normal range of motion.     Cervical back: Normal range of motion.  Skin:    General: Skin is warm and dry.  Neurological:     General: No focal deficit present.     Mental Status: She is alert and oriented to person, place, and time.  Psychiatric:        Mood and Affect: Mood normal.        Behavior: Behavior normal.      SVE: 1/20/-3 FHT: FHR 125, moderate variability, accelerations +, no deceleration. Toco: irregular Cat 1   Prenatal labs: ABO, Rh:  A+ Antibody:  negative Rubella:  immune RPR:   non-reactive HBsAg:   negative HIV:   non-reactive GBS:   positive   Assessment/Plan: 38 y.o.  H2E4984 at 39w admitted for eIOL.   Elevated BP w/o diagnosis of HTN - MRBP  on admit, PIH labs ordered AMA - LR NIPT Anxiety/Depression - citalopram 20 mg  Abnl Carrier Screen -   carrier of Cystic Fibrosis and Polycystic Kidney Disease, Autosomal Recessive, FOB was tested with MFM, results neg per pt report Satisfied fertility - Medicaid tubal papers signed 10/9 Umbilical Mesh - hx umbilical hernia repair   Female/ (10/9) EFW > 90% (7lb5oz = 3308g),AFI 14.35cm Pelvis proven to 9#15oz Contraception: desires BTL   Plan: - Cytotec  25 mcg per vagina q4hr  - Pitocin  and AROM PRN - PCN for GBS prophylaxis  - IV pain medication until in active labor, epidural PRN  Penny Bartlett Oz 03/18/2024, 3:48 PM

## 2024-03-18 NOTE — Anesthesia Preprocedure Evaluation (Signed)
 Anesthesia Evaluation  Patient identified by MRN, date of birth, ID band Patient awake    Reviewed: Allergy & Precautions, Patient's Chart, lab work & pertinent test results  History of Anesthesia Complications Negative for: history of anesthetic complications  Airway Mallampati: II  TM Distance: >3 FB Neck ROM: Full    Dental no notable dental hx.    Pulmonary neg pulmonary ROS   Pulmonary exam normal        Cardiovascular hypertension (gestational), Normal cardiovascular exam     Neuro/Psych negative neurological ROS     GI/Hepatic Neg liver ROS,GERD  ,,  Endo/Other  negative endocrine ROS    Renal/GU negative Renal ROS     Musculoskeletal negative musculoskeletal ROS (+)    Abdominal   Peds  Hematology negative hematology ROS (+)   Anesthesia Other Findings   Reproductive/Obstetrics (+) Pregnancy                              Anesthesia Physical Anesthesia Plan  ASA: 2  Anesthesia Plan: Epidural   Post-op Pain Management:    Induction:   PONV Risk Score and Plan: Treatment may vary due to age or medical condition  Airway Management Planned: Natural Airway  Additional Equipment: Fetal Monitoring  Intra-op Plan:   Post-operative Plan:   Informed Consent: I have reviewed the patients History and Physical, chart, labs and discussed the procedure including the risks, benefits and alternatives for the proposed anesthesia with the patient or authorized representative who has indicated his/her understanding and acceptance.       Plan Discussed with:   Anesthesia Plan Comments:         Anesthesia Quick Evaluation

## 2024-03-18 NOTE — Progress Notes (Signed)
 Penny Bartlett is a 38 y.o. H2E4984 at [redacted]w[redacted]d admitted for eIOL  Subjective: Mild pain with contraction. Denies LOF or VB. Good fetal movement  Objective: BP (!) 145/86   Pulse 77   Temp (!) 97.5 F (36.4 C) (Oral)   Resp 18   Ht 5' 6 (1.676 m)   Wt 110 kg   LMP 06/19/2023   BMI 39.14 kg/m  No intake/output data recorded. No intake/output data recorded.  FHT: FHR 135, moderate variability, accelerations +, no deceleration. Toco: irregular  Cat 1   SVE:   Dilation: 4 Effacement (%): 50 Station: -2 Exam by:: Dr. Gib  Labs: Lab Results  Component Value Date   WBC 8.4 03/18/2024   HGB 10.4 (L) 03/18/2024   HCT 31.0 (L) 03/18/2024   MCV 93.7 03/18/2024   PLT 250 03/18/2024    Assessment / Plan: Progressing well  S/p misoprostol  x 1 Pitocin  ordered Recheck in 4 hr w/ likely AROM Epidural PRN  Has ruled in for gHTN - PIH labs unremarkable, CMP in process    Penny CHRISTELLA Oz, MD 03/18/2024, 8:39 PM

## 2024-03-18 NOTE — Anesthesia Procedure Notes (Signed)
 Epidural Patient location during procedure: OB Start time: 03/18/2024 11:08 PM End time: 03/18/2024 11:11 PM  Staffing Anesthesiologist: Paul Lamarr BRAVO, MD Performed: anesthesiologist   Preanesthetic Checklist Completed: patient identified, IV checked, risks and benefits discussed, monitors and equipment checked, pre-op evaluation and timeout performed  Epidural Patient position: sitting Prep: DuraPrep and site prepped and draped Patient monitoring: continuous pulse ox, blood pressure and heart rate Approach: midline Location: L3-L4 Injection technique: LOR air  Needle:  Needle type: Tuohy  Needle gauge: 17 G Needle length: 9 cm Needle insertion depth: 8 cm Catheter type: closed end flexible Catheter size: 19 Gauge Catheter at skin depth: 13 cm Test dose: negative and Other (1% lidocaine )  Assessment Events: blood not aspirated, no cerebrospinal fluid, injection not painful, no injection resistance, no paresthesia and negative IV test  Additional Notes Patient identified. Risks, benefits, and alternatives discussed with patient including but not limited to bleeding, infection, nerve damage, paralysis, failed block, incomplete pain control, headache, blood pressure changes, nausea, vomiting, reactions to medication, itching, and postpartum back pain. Confirmed with bedside nurse the patient's most recent platelet count. Confirmed with patient that they are not currently taking any anticoagulation, have any bleeding history, or any family history of bleeding disorders. Patient expressed understanding and wished to proceed. All questions were answered. Sterile technique was used throughout the entire procedure. Please see nursing notes for vital signs.   Crisp LOR after one needle redirection. Test dose was given through epidural catheter and negative prior to continuing to dose epidural or start infusion. Warning signs of high block given to the patient including shortness of  breath, tingling/numbness in hands, complete motor block, or any concerning symptoms with instructions to call for help. Patient was given instructions on fall risk and not to get out of bed. All questions and concerns addressed with instructions to call with any issues or inadequate analgesia.  Reason for block:procedure for pain

## 2024-03-19 ENCOUNTER — Encounter (HOSPITAL_COMMUNITY): Payer: Self-pay

## 2024-03-19 LAB — COMPREHENSIVE METABOLIC PANEL WITH GFR
ALT: 14 U/L (ref 0–44)
AST: 30 U/L (ref 15–41)
Albumin: 2.1 g/dL — ABNORMAL LOW (ref 3.5–5.0)
Alkaline Phosphatase: 67 U/L (ref 38–126)
Anion gap: 13 (ref 5–15)
BUN: 6 mg/dL (ref 6–20)
CO2: 17 mmol/L — ABNORMAL LOW (ref 22–32)
Calcium: 8.2 mg/dL — ABNORMAL LOW (ref 8.9–10.3)
Chloride: 106 mmol/L (ref 98–111)
Creatinine, Ser: 0.75 mg/dL (ref 0.44–1.00)
GFR, Estimated: >60 mL/min (ref >60)
Glucose, Bld: 157 mg/dL — ABNORMAL HIGH (ref 70–99)
Potassium: 3.7 mmol/L (ref 3.5–5.1)
Sodium: 136 mmol/L (ref 135–145)
Total Bilirubin: 0.4 mg/dL (ref 0.0–1.2)
Total Protein: 5.1 g/dL — ABNORMAL LOW (ref 6.5–8.1)

## 2024-03-19 LAB — CBC
HCT: 29 % — ABNORMAL LOW (ref 36.0–46.0)
Hemoglobin: 9.7 g/dL — ABNORMAL LOW (ref 12.0–15.0)
MCH: 31.4 pg (ref 26.0–34.0)
MCHC: 33.4 g/dL (ref 30.0–36.0)
MCV: 93.9 fL (ref 80.0–100.0)
Platelets: 222 K/uL (ref 150–400)
RBC: 3.09 MIL/uL — ABNORMAL LOW (ref 3.87–5.11)
RDW: 14.8 % (ref 11.5–15.5)
WBC: 13.9 K/uL — ABNORMAL HIGH (ref 4.0–10.5)
nRBC: 0 % (ref 0.0–0.2)

## 2024-03-19 LAB — RPR: RPR Ser Ql: NONREACTIVE

## 2024-03-19 MED ORDER — WITCH HAZEL-GLYCERIN EX PADS: 1.0000 | MEDICATED_PAD | CUTANEOUS | Status: DC | PRN

## 2024-03-19 MED ORDER — ONDANSETRON HCL 4 MG PO TABS
4.0000 mg | ORAL_TABLET | ORAL | Status: DC | PRN
Start: 2024-03-19 — End: 2024-03-20

## 2024-03-19 MED ORDER — PRENATAL MULTIVITAMIN CH
1.0000 | ORAL_TABLET | Freq: Every day | ORAL | Status: DC
  Filled 2024-03-19: qty 1

## 2024-03-19 MED ORDER — ONDANSETRON HCL 4 MG/2ML IJ SOLN: 4.0000 mg | INTRAMUSCULAR | Status: DC | PRN

## 2024-03-19 MED ORDER — ACETAMINOPHEN 325 MG PO TABS: 650.0000 mg | ORAL_TABLET | ORAL | Status: DC | PRN

## 2024-03-19 MED ORDER — DOCUSATE SODIUM 100 MG PO CAPS
100.0000 mg | ORAL_CAPSULE | Freq: Two times a day (BID) | ORAL | Status: DC
  Administered 2024-03-20: 100 mg via ORAL
  Filled 2024-03-19: qty 1

## 2024-03-19 MED ORDER — TETANUS-DIPHTH-ACELL PERTUSSIS 5-2-15.5 LF-MCG/0.5 IM SUSP: 0.5000 mL | Freq: Once | INTRAMUSCULAR | Status: DC

## 2024-03-19 MED ORDER — DIBUCAINE (PERIANAL) 1 % EX OINT: 1.0000 | TOPICAL_OINTMENT | CUTANEOUS | Status: DC | PRN

## 2024-03-19 MED ORDER — ACETAMINOPHEN 160 MG/5ML PO SOLN
650.0000 mg | Freq: Four times a day (QID) | ORAL | Status: DC | PRN
  Administered 2024-03-19: 650 mg via ORAL
  Filled 2024-03-19: qty 20.3

## 2024-03-19 MED ORDER — CITALOPRAM HYDROBROMIDE 20 MG PO TABS: 20.0000 mg | ORAL_TABLET | Freq: Every day | ORAL | Status: DC

## 2024-03-19 MED ORDER — NIFEDIPINE ER OSMOTIC RELEASE 30 MG PO TB24
30.0000 mg | ORAL_TABLET | Freq: Every day | ORAL | Status: DC
  Administered 2024-03-19 – 2024-03-20 (×2): 30 mg via ORAL
  Filled 2024-03-19 (×2): qty 1

## 2024-03-19 MED ORDER — OXYCODONE HCL 5 MG PO TABS: 10.0000 mg | ORAL_TABLET | ORAL | Status: DC | PRN

## 2024-03-19 MED ORDER — IBUPROFEN 600 MG PO TABS
600.0000 mg | ORAL_TABLET | Freq: Four times a day (QID) | ORAL | Status: DC
  Administered 2024-03-19: 600 mg via ORAL
  Filled 2024-03-19 (×2): qty 1

## 2024-03-19 MED ORDER — SIMETHICONE 80 MG PO CHEW: 80.0000 mg | CHEWABLE_TABLET | ORAL | Status: DC | PRN

## 2024-03-19 MED ORDER — OXYCODONE HCL 5 MG PO TABS: 5.0000 mg | ORAL_TABLET | ORAL | Status: DC | PRN

## 2024-03-19 MED ORDER — COCONUT OIL OIL
1.0000 | TOPICAL_OIL | Status: DC | PRN
  Administered 2024-03-19: 1 via TOPICAL

## 2024-03-19 MED ORDER — BENZOCAINE-MENTHOL 20-0.5 % EX AERO
1.0000 | INHALATION_SPRAY | CUTANEOUS | Status: DC | PRN
  Administered 2024-03-19: 1 via TOPICAL
  Filled 2024-03-19: qty 56

## 2024-03-19 MED ORDER — DIPHENHYDRAMINE HCL 25 MG PO CAPS: 25.0000 mg | ORAL_CAPSULE | Freq: Four times a day (QID) | ORAL | Status: DC | PRN

## 2024-03-19 MED ORDER — IBUPROFEN 100 MG/5ML PO SUSP
600.0000 mg | Freq: Four times a day (QID) | ORAL | Status: DC | PRN
Start: 2024-03-19 — End: 2024-03-20
  Administered 2024-03-19 (×2): 600 mg via ORAL
  Filled 2024-03-19 (×2): qty 30

## 2024-03-19 NOTE — Progress Notes (Addendum)
 Post Partum Day 0 Subjective: up ad lib, voiding, tolerating PO, + flatus, and lochia moderate. She reports uterine cramping with breastfeeding as expected. She denies HA, CP, SOB. She is bonding well with baby. Requesting liquid versions of ibuprofen  and tylenol    Objective: Blood pressure 134/81, pulse 81, temperature 97.8 F (36.6 C), temperature source Oral, resp. rate 15, height 5' 6 (1.676 m), weight 110 kg, last menstrual period 06/19/2023, SpO2 98%, unknown if currently breastfeeding.  Physical Exam:  General: alert, cooperative, and no distress Lochia: appropriate Uterine Fundus: firm Incision: n/a DVT Evaluation: No evidence of DVT seen on physical exam. Calf/Ankle edema is present.  Recent Labs    03/18/24 2225 03/19/24 0510  HGB 10.6* 9.7*  HCT 31.5* 29.0*    Assessment/Plan: 38yo H2E3983 on PPD#0 -1. Transiently elevated BP - BP has been normal range since admitted to pp floor. Plan to continue to monitor per routine protocol -2. Anxiety/Depression - on citalopram 20mg  -3. Satisfied fertility - Interval tubal ligation planned ( medicaid papers signed 10/9) -4 Routine pp care. Liquid medication ordered. Possible discharge to home tomorrow    LOS: 1 day   Ted LELON Solo, DO 03/19/2024, 1:18 PM

## 2024-03-19 NOTE — Progress Notes (Signed)
 Penny Bartlett is a 38 y.o. H2E4984 at [redacted]w[redacted]d admitted for eIOL, has now ruled in for gHTN  Subjective: Comfortable w/ epidural. Denies LOF. Minimal VB. Good fetal movement  Objective: BP (!) 148/95   Pulse 74   Temp 97.8 F (36.6 C) (Oral)   Resp 17   Ht 5' 6 (1.676 m)   Wt 110 kg   LMP 06/19/2023   BMI 39.14 kg/m  No intake/output data recorded. No intake/output data recorded.  FHT: FHR 130, moderate variability, accelerations +, no deceleration. Toco: q2 min Cat 1   SVE:   7/90/-1   Labs: Lab Results  Component Value Date   WBC 9.5 03/18/2024   HGB 10.6 (L) 03/18/2024   HCT 31.5 (L) 03/18/2024   MCV 94.0 03/18/2024   PLT 248 03/18/2024    Assessment / Plan: Progressing well  S/p misoprostol  x 1 Pitocin  since 20:35  AROM @ 12:25 Epidural in place Recheck in 2hr or sooner PRN Anticipate SVD  gHTN - PIF labs nml, BP continue to be mild range. Will start Procardia 30 mg post-partum   [name], MD 03/19/2024, 12:25 AM

## 2024-03-19 NOTE — Lactation Note (Addendum)
 This note was copied from a baby's chart. Lactation Consultation Note  Patient Name: Boy Gracy Ehly Unijb'd Date: 03/19/2024 Age:38 hours Reason for consult: Initial assessment  P6, Baby sleeping on mother's chest after recent feeding. Feed on demand with cues.  Goal 8-12+ times per day after first 24 hrs.  Place baby STS if not cueing.  Discussed cluster feeding and pacifier use not recommended at this time.  Baby has breastfed x 4 since birth.  Praised mother for her efforts and suggest calling for help as needed.   Maternal Data Has patient been taught Hand Expression?: Yes Does the patient have breastfeeding experience prior to this delivery?: Yes How long did the patient breastfeed?: 2 years with the last child  Feeding Mother's Current Feeding Choice: Breast Milk   Interventions Interventions: Breast feeding basics reviewed;Education  Discharge Pump: Personal;Hands Free  Consult Status Consult Status: Follow-up Date: 03/20/24 Follow-up type: In-patient   Shannon Levorn Lemme  RN, IBCLC 03/19/2024, 8:37 AM

## 2024-03-19 NOTE — Plan of Care (Signed)
  Problem: Education: Goal: Knowledge of Childbirth will improve Outcome: Adequate for Discharge Goal: Ability to make informed decisions regarding treatment and plan of care will improve Outcome: Adequate for Discharge Goal: Ability to state and carry out methods to decrease the pain will improve Outcome: Adequate for Discharge Goal: Individualized Educational Video(s) Outcome: Not Applicable   Problem: Coping: Goal: Ability to verbalize concerns and feelings about labor and delivery will improve Outcome: Completed/Met   Problem: Life Cycle: Goal: Ability to make normal progression through stages of labor will improve Outcome: Completed/Met Goal: Ability to effectively push during vaginal delivery will improve Outcome: Completed/Met   Problem: Role Relationship: Goal: Will demonstrate positive interactions with the child Outcome: Adequate for Discharge   Problem: Safety: Goal: Risk of complications during labor and delivery will decrease Outcome: Adequate for Discharge   Problem: Pain Management: Goal: Relief or control of pain from uterine contractions will improve Outcome: Adequate for Discharge

## 2024-03-19 NOTE — Anesthesia Postprocedure Evaluation (Signed)
 Anesthesia Post Note  Patient: Penny Bartlett  Procedure(s) Performed: AN AD HOC LABOR EPIDURAL     Patient location during evaluation: Mother Baby Anesthesia Type: Epidural Level of consciousness: awake and alert and oriented Pain management: satisfactory to patient Vital Signs Assessment: post-procedure vital signs reviewed and stable Respiratory status: respiratory function stable Cardiovascular status: stable Postop Assessment: no headache, no backache, epidural receding, patient able to bend at knees, no signs of nausea or vomiting, adequate PO intake and able to ambulate Anesthetic complications: no   No notable events documented.  Last Vitals:  Vitals:   03/19/24 0445 03/19/24 0536  BP: 135/83 126/83  Pulse: 75 85  Resp: 18 18  Temp: 36.4 C 36.7 C  SpO2: 99% 98%    Last Pain:  Vitals:   03/19/24 0736  TempSrc:   PainSc: 2    Pain Goal:                   [name]

## 2024-03-20 ENCOUNTER — Other Ambulatory Visit (HOSPITAL_COMMUNITY): Payer: Self-pay

## 2024-03-20 MED ORDER — NIFEDIPINE ER 30 MG PO TB24
30.0000 mg | ORAL_TABLET | Freq: Every day | ORAL | 1 refills | Status: AC
  Filled 2024-03-20: qty 30, 30d supply, fill #0

## 2024-03-20 MED ORDER — NIFEDIPINE ER 30 MG PO TB24: 30.0000 mg | ORAL_TABLET | Freq: Every day | ORAL | 1 refills | Status: DC

## 2024-03-20 NOTE — Progress Notes (Signed)
 Patient is doing well.  She is ambulating, voiding, tolerating PO.  Pain control is good.  Lochia is appropriate  Vitals:   03/19/24 1336 03/19/24 1736 03/19/24 2214 03/20/24 0630  BP: 122/75 130/84 123/83 121/79  Pulse: 82 80 72 89  Resp: 15 16 18 18   Temp: 97.7 F (36.5 C) 98 F (36.7 C) 98 F (36.7 C) 97.6 F (36.4 C)  TempSrc: Oral Oral Oral Oral  SpO2: 98% 98%  99%  Weight:      Height:        NAD Fundus firm Ext: no pedal edema  Lab Results  Component Value Date   WBC 13.9 (H) 03/19/2024   HGB 9.7 (L) 03/19/2024   HCT 29.0 (L) 03/19/2024   MCV 93.9 03/19/2024   PLT 222 03/19/2024    --/--/A POS (11/10 1613)  A/P 38 y.o. [id number] PPD#1. Routine postpartum care. GHTN: diagnosed in labor.  BPs at goal on nifedipine Xl 30 mg daily Anxiety / depression: continue home citalopram 20 mg daily   Desires discharge today--meeting all goals Desires circumcision.   Discussed r/b/a of the procedure.  Reviewed that circumcision is an elective surgical procedure and not considered medically necessary.  Reviewed the risks of the procedure including the risk of infection, bleeding, damage to surrounding structures, including scrotum, shaft, urethra and head of penis, and an undesired cosmetic effect requiring additional procedures for revision.  Consent signed.    [facility] [employer]

## 2024-03-20 NOTE — Lactation Note (Signed)
 This note was copied from a baby's chart. Lactation Consultation Note  Patient Name: Boy Yemariam Ulloa Unijb'd Date: 03/20/2024 - late entry - seen at 0927  Age:38 hours, P6  Reason for consult: Follow-up assessment Per mom the baby cluster fed last night and recently had  short feeding for 5 mins. Baby was cluster feeding last might.  LC reviewed breast feeding goals for 24 hours - to feed with cues and by 3 hours offer the breast STS until the baby is back to birth weight and gaining well.  LC reviewed breast feeding D/C teaching and the [facility] resources.  Per mom has a hand pump and a DEBP at home.    Maternal Data Has patient been taught Hand Expression?: Yes Does the patient have breastfeeding experience prior to this delivery?: Yes  Feeding Mother's Current Feeding Choice: Breast Milk and Formula  LATCH Score- 8     Lactation Tools Discussed/Used  None needed   Interventions Interventions: Breast feeding basics reviewed;Education;LC Services brochure;CDC milk storage guidelines;CDC Guidelines for Breast Pump Cleaning  Discharge Discharge Education: Engorgement and breast care;Warning signs for feeding baby Pump: Personal;Manual;Hands Free  Consult Status Consult Status: Complete Date: 03/20/24    Rollene Jenkins Fiedler 03/20/2024, 12:41 PM

## 2024-03-20 NOTE — Progress Notes (Signed)
 MOB was referred for history of depression/anxiety.  * Referral screened out by Clinical Social Worker because none of the following criteria appear to apply:  ~ History of anxiety/depression during this pregnancy, or of post-partum depression following prior delivery.  ~ Diagnosis of anxiety and/or depression within last 3 years  OR  * MOB's symptoms currently being treated with medication and/or therapy.  Per OB notes, MOB has an active prescription for Citalopram 10mg  for support.  Edinburgh score 0  Please contact the Clinical Social Worker if needs arise, by [facility] request, or if MOB scores greater than 9/yes to question 10 on Edinburgh Postpartum Depression Screen.  Rosina Molt, [place] Clinical Social Worker [phone]

## 2024-03-20 NOTE — Discharge Summary (Signed)
 Postpartum Discharge Summary  Date of Service updated 03/20/24     Patient Name: Penny Bartlett DOB: 12/10/1985 MRN: [id number]  Date of admission: 03/18/2024 Delivery date:03/19/2024 Delivering provider: LANE CHARMAINE HERO Date of discharge: 03/20/2024  Admitting diagnosis: Pregnant [Z34.90] Intrauterine pregnancy: [redacted]w[redacted]d     Secondary diagnosis:  Principal Problem:   Pregnant Active Problems:   Gestational hypertension  Additional problems: None    Discharge diagnosis: Term Pregnancy Delivered and Gestational Hypertension                                              Post partum procedures:n/a Augmentation: AROM, Pitocin , and Cytotec  Complications: None  Hospital course: Induction of Labor With Vaginal Delivery   38 y.o. yo H2E3983 at [redacted]w[redacted]d was admitted to the hospital 03/18/2024 for induction of labor.  Indication for induction: Elective.  Patient had an labor course complicated by development of gestational hypertension for which she was started on nifedipine XL 30 mg daily with excellent improvement in BP Membrane Rupture Time/Date: 12:22 AM,03/19/2024  Delivery Method:Vaginal, Spontaneous Operative Delivery:N/A Episiotomy: None Lacerations:  Periurethral Details of delivery can be found in separate delivery note.  Patient had a postpartum course complicated by n/a. Patient is discharged home 03/20/24.  Newborn Data: Birth date:03/19/2024 Birth time:2:33 AM Gender:Female Living status:Living Apgars:9 ,9  Weight:4500 g  Magnesium Sulfate received: No BMZ received: No Rhophylac:N/A MMR:N/A T-DaP:Given prenatally Flu: No RSV Vaccine received: No Transfusion:No Immunizations administered: There is no immunization history for the selected administration types on file for this patient.  Physical exam  Vitals:   03/19/24 1336 03/19/24 1736 03/19/24 2214 03/20/24 0630  BP: 122/75 130/84 123/83 121/79  Pulse: 82 80 72 89  Resp: 15 16 18 18   Temp: 97.7  F (36.5 C) 98 F (36.7 C) 98 F (36.7 C) 97.6 F (36.4 C)  TempSrc: Oral Oral Oral Oral  SpO2: 98% 98%  99%  Weight:      Height:       General: alert, cooperative, and no distress Lochia: appropriate Uterine Fundus: firm Incision: Dressing is clean, dry, and intact DVT Evaluation: No evidence of DVT seen on physical exam. Labs: Lab Results  Component Value Date   WBC 13.9 (H) 03/19/2024   HGB 9.7 (L) 03/19/2024   HCT 29.0 (L) 03/19/2024   MCV 93.9 03/19/2024   PLT 222 03/19/2024      Latest Ref Rng & Units 03/19/2024    5:10 AM  CMP  Glucose 70 - 99 mg/dL 842   BUN 6 - 20 mg/dL 6   Creatinine 9.55 - 8.99 mg/dL 9.24   Sodium 864 - 854 mmol/L 136   Potassium 3.5 - 5.1 mmol/L 3.7   Chloride 98 - 111 mmol/L 106   CO2 22 - 32 mmol/L 17   Calcium 8.9 - 10.3 mg/dL 8.2   Total Protein 6.5 - 8.1 g/dL 5.1   Total Bilirubin 0.0 - 1.2 mg/dL 0.4   Alkaline Phos 38 - 126 U/L 67   AST 15 - 41 U/L 30   ALT 0 - 44 U/L 14    Edinburgh Score:    03/19/2024    5:36 PM  Edinburgh Postnatal Depression Scale Screening Tool  I have been able to laugh and see the funny side of things. 0  I have looked forward with enjoyment to things. 0  I have blamed myself unnecessarily when things went wrong. 0  I have been anxious or worried for no good reason. 0  I have felt scared or panicky for no good reason. 0  Things have been getting on top of me. 0  I have been so unhappy that I have had difficulty sleeping. 0  I have felt sad or miserable. 0  I have been so unhappy that I have been crying. 0  The thought of harming myself has occurred to me. 0  Edinburgh Postnatal Depression Scale Total 0      After visit meds:  Allergies as of 03/20/2024   No Known Allergies      Medication List     STOP taking these medications    calcium carbonate 500 MG chewable tablet Commonly known as: TUMS - dosed in mg elemental calcium       TAKE these medications    acetaminophen  325 MG  tablet Commonly known as: TYLENOL  Take 650 mg by mouth every 6 (six) hours as needed.   citalopram 20 MG tablet Commonly known as: CELEXA Take 20 mg by mouth daily.   NIFEdipine 30 MG 24 hr tablet Commonly known as: ADALAT CC Take 1 tablet (30 mg total) by mouth daily.   PRENATAL GUMMIES PO Take by mouth.         Discharge home in stable condition Infant Feeding: Breast Infant Disposition:home with mother Discharge instruction: per After Visit Summary and Postpartum booklet. Activity: Advance as tolerated. Pelvic rest for 6 weeks.  Diet: routine diet Anticipated Birth Control: Plans Interval BTL Postpartum Appointment:6 weeks Additional Postpartum F/U: BP check 1 week Future Appointments:No future appointments. Follow up Visit:  Follow-up Information     Lane Charmaine HERO, MD Follow up in 1 week(s).   Specialty: Obstetrics and Gynecology Why: Plan BP check in office in 5-7 days Contact information: [address] [place] [address] [phone]                     03/20/2024 [facility] VELNA GASKINS, MD

## 2024-04-01 ENCOUNTER — Inpatient Hospital Stay (HOSPITAL_COMMUNITY)

## 2024-04-02 ENCOUNTER — Telehealth (HOSPITAL_COMMUNITY): Payer: Self-pay | Admitting: *Deleted

## 2024-04-02 NOTE — Telephone Encounter (Signed)
 04/02/2024  Name: MARGRET MOAT MRN: [id number] DOB: 08-22-1985  Reason for Call:  Transition of Care Hospital Discharge Call  Contact Status: Patient Contact Status: Complete  Language assistant needed: Interpreter Mode: Interpreter Not Needed        Follow-Up Questions: Do You Have Any Concerns About Your Health As You Heal From Delivery?: No Do You Have Any Concerns About Your Infants Health?: No  Edinburgh Postnatal Depression Scale:  In the Past 7 Days:    PHQ2-9 Depression Scale:     Discharge Follow-up: Edinburgh score requires follow up?:  (Patient says her answers are the same as in the hospital when score was 0, endorses she is doing well emotionally.) Patient was advised of the following resources:: Support Group, Breastfeeding Support Group (declines postpartum group information via email)  Post-discharge interventions: Reviewed Newborn Safe Sleep Practices  Mliss Sieve, RN 04/02/2024 14:35
